# Patient Record
Sex: Female | Born: 1985 | Race: White | Hispanic: No | Smoking: Former smoker
Health system: Southern US, Community
[De-identification: ages and names within clinical notes are randomized; demographics above are authoritative.]

## PROBLEM LIST (undated history)

## (undated) ENCOUNTER — Inpatient Hospital Stay (HOSPITAL_COMMUNITY): Payer: Self-pay

## (undated) DIAGNOSIS — F988 Other specified behavioral and emotional disorders with onset usually occurring in childhood and adolescence: Secondary | ICD-10-CM

## (undated) DIAGNOSIS — N189 Chronic kidney disease, unspecified: Secondary | ICD-10-CM

## (undated) DIAGNOSIS — R102 Pelvic and perineal pain: Secondary | ICD-10-CM

## (undated) DIAGNOSIS — M5430 Sciatica, unspecified side: Secondary | ICD-10-CM

## (undated) DIAGNOSIS — D649 Anemia, unspecified: Secondary | ICD-10-CM

## (undated) HISTORY — DX: Sciatica, unspecified side: M54.30

## (undated) HISTORY — PX: OTHER SURGICAL HISTORY: SHX169

---
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
## 2003-05-31 ENCOUNTER — Emergency Department (HOSPITAL_COMMUNITY): Admission: EM | Admit: 2003-05-31 | Discharge: 2003-05-31 | Payer: Self-pay | Admitting: Emergency Medicine

## 2003-11-16 ENCOUNTER — Emergency Department (HOSPITAL_COMMUNITY): Admission: EM | Admit: 2003-11-16 | Discharge: 2003-11-16 | Payer: Self-pay | Admitting: Family Medicine

## 2005-05-05 ENCOUNTER — Emergency Department (HOSPITAL_COMMUNITY): Admission: EM | Admit: 2005-05-05 | Discharge: 2005-05-05 | Payer: Self-pay | Admitting: Family Medicine

## 2005-09-10 ENCOUNTER — Encounter: Admission: RE | Admit: 2005-09-10 | Discharge: 2005-09-10 | Payer: Self-pay | Admitting: Family Medicine

## 2006-01-11 ENCOUNTER — Ambulatory Visit (HOSPITAL_COMMUNITY): Admission: RE | Admit: 2006-01-11 | Discharge: 2006-01-12 | Payer: Self-pay | Admitting: Otolaryngology

## 2006-01-11 ENCOUNTER — Encounter (INDEPENDENT_AMBULATORY_CARE_PROVIDER_SITE_OTHER): Payer: Self-pay | Admitting: *Deleted

## 2006-01-11 HISTORY — PX: TONSILLECTOMY: SUR1361

## 2006-05-21 ENCOUNTER — Emergency Department (HOSPITAL_COMMUNITY): Admission: EM | Admit: 2006-05-21 | Discharge: 2006-05-21 | Payer: Self-pay | Admitting: Emergency Medicine

## 2007-06-13 ENCOUNTER — Emergency Department (HOSPITAL_COMMUNITY): Admission: EM | Admit: 2007-06-13 | Discharge: 2007-06-13 | Payer: Self-pay | Admitting: Family Medicine

## 2008-05-10 ENCOUNTER — Emergency Department (HOSPITAL_COMMUNITY): Admission: EM | Admit: 2008-05-10 | Discharge: 2008-05-10 | Payer: Self-pay | Admitting: Emergency Medicine

## 2008-05-21 HISTORY — PX: LEEP: SHX91

## 2008-05-27 ENCOUNTER — Ambulatory Visit: Payer: Self-pay | Admitting: Family Medicine

## 2008-05-27 ENCOUNTER — Encounter: Admission: RE | Admit: 2008-05-27 | Discharge: 2008-05-27 | Payer: Self-pay | Admitting: Family Medicine

## 2008-06-09 ENCOUNTER — Ambulatory Visit: Payer: Self-pay | Admitting: Family Medicine

## 2008-06-09 ENCOUNTER — Other Ambulatory Visit: Admission: RE | Admit: 2008-06-09 | Discharge: 2008-06-09 | Payer: Self-pay | Admitting: Family Medicine

## 2008-06-12 ENCOUNTER — Emergency Department (HOSPITAL_COMMUNITY): Admission: EM | Admit: 2008-06-12 | Discharge: 2008-06-12 | Payer: Self-pay | Admitting: Emergency Medicine

## 2008-06-14 ENCOUNTER — Emergency Department (HOSPITAL_COMMUNITY): Admission: EM | Admit: 2008-06-14 | Discharge: 2008-06-14 | Payer: Self-pay | Admitting: Family Medicine

## 2008-08-09 ENCOUNTER — Ambulatory Visit: Payer: Self-pay | Admitting: Family Medicine

## 2008-08-27 ENCOUNTER — Encounter (INDEPENDENT_AMBULATORY_CARE_PROVIDER_SITE_OTHER): Payer: Self-pay | Admitting: General Surgery

## 2008-08-27 ENCOUNTER — Ambulatory Visit (HOSPITAL_BASED_OUTPATIENT_CLINIC_OR_DEPARTMENT_OTHER): Admission: RE | Admit: 2008-08-27 | Discharge: 2008-08-27 | Payer: Self-pay | Admitting: General Surgery

## 2008-08-27 HISTORY — PX: PILONIDAL CYST EXCISION: SHX744

## 2008-10-04 ENCOUNTER — Ambulatory Visit: Payer: Self-pay | Admitting: Family Medicine

## 2009-01-28 ENCOUNTER — Ambulatory Visit (HOSPITAL_COMMUNITY): Admission: RE | Admit: 2009-01-28 | Discharge: 2009-01-28 | Payer: Self-pay | Admitting: Specialist

## 2009-01-28 ENCOUNTER — Encounter (INDEPENDENT_AMBULATORY_CARE_PROVIDER_SITE_OTHER): Payer: Self-pay | Admitting: Obstetrics & Gynecology

## 2009-01-28 HISTORY — PX: DILATION AND CURETTAGE OF UTERUS: SHX78

## 2009-01-28 HISTORY — PX: CERVICAL CONIZATION W/BX: SHX1330

## 2009-04-01 ENCOUNTER — Ambulatory Visit: Payer: Self-pay | Admitting: Family Medicine

## 2010-01-23 ENCOUNTER — Emergency Department (HOSPITAL_COMMUNITY): Admission: EM | Admit: 2010-01-23 | Discharge: 2010-01-24 | Payer: Self-pay | Admitting: Emergency Medicine

## 2010-02-13 ENCOUNTER — Ambulatory Visit: Payer: Self-pay | Admitting: Family Medicine

## 2010-03-07 ENCOUNTER — Ambulatory Visit: Payer: Self-pay | Admitting: Family Medicine

## 2010-08-03 LAB — URINE MICROSCOPIC-ADD ON

## 2010-08-03 LAB — URINALYSIS, ROUTINE W REFLEX MICROSCOPIC
Bilirubin Urine: NEGATIVE
Glucose, UA: NEGATIVE mg/dL
Ketones, ur: NEGATIVE mg/dL
Leukocytes, UA: NEGATIVE
Nitrite: NEGATIVE
Protein, ur: NEGATIVE mg/dL
Specific Gravity, Urine: 1.017 (ref 1.005–1.030)
Urobilinogen, UA: 1 mg/dL (ref 0.0–1.0)
pH: 6 (ref 5.0–8.0)

## 2010-08-03 LAB — POCT PREGNANCY, URINE: Preg Test, Ur: NEGATIVE

## 2010-08-25 LAB — CBC
HCT: 39.8 % (ref 36.0–46.0)
Hemoglobin: 13.5 g/dL (ref 12.0–15.0)
MCV: 91.4 fL (ref 78.0–100.0)
Platelets: 230 10*3/uL (ref 150–400)
RBC: 4.36 MIL/uL (ref 3.87–5.11)
WBC: 7.9 10*3/uL (ref 4.0–10.5)

## 2010-08-25 LAB — PROTIME-INR: INR: 0.9 (ref 0.00–1.49)

## 2010-08-29 ENCOUNTER — Inpatient Hospital Stay (HOSPITAL_COMMUNITY): Payer: Medicaid Other

## 2010-08-29 ENCOUNTER — Inpatient Hospital Stay (HOSPITAL_COMMUNITY)
Admission: AD | Admit: 2010-08-29 | Discharge: 2010-08-29 | Disposition: A | Discharge status: Home / Self Care | Payer: Medicaid Other | Source: Ambulatory Visit | Attending: Family Medicine | Admitting: Family Medicine

## 2010-08-29 DIAGNOSIS — O209 Hemorrhage in early pregnancy, unspecified: Secondary | ICD-10-CM | POA: Insufficient documentation

## 2010-08-29 LAB — CBC
HCT: 36.1 % (ref 36.0–46.0)
MCHC: 33.2 g/dL (ref 30.0–36.0)
MCV: 90 fL (ref 78.0–100.0)
Platelets: 181 10*3/uL (ref 150–400)
RDW: 13.1 % (ref 11.5–15.5)

## 2010-08-29 LAB — URINE MICROSCOPIC-ADD ON

## 2010-08-29 LAB — URINALYSIS, ROUTINE W REFLEX MICROSCOPIC
Ketones, ur: NEGATIVE mg/dL
Protein, ur: NEGATIVE mg/dL
pH: 6.5 (ref 5.0–8.0)

## 2010-08-29 LAB — WET PREP, GENITAL
Clue Cells Wet Prep HPF POC: NONE SEEN
Trich, Wet Prep: NONE SEEN

## 2010-08-30 LAB — POCT HEMOGLOBIN-HEMACUE: Hemoglobin: 13.2 g/dL (ref 12.0–15.0)

## 2010-09-04 LAB — CULTURE, ROUTINE-ABSCESS

## 2010-09-30 ENCOUNTER — Inpatient Hospital Stay (HOSPITAL_COMMUNITY)
Admission: AD | Admit: 2010-09-30 | Discharge: 2010-09-30 | Disposition: A | Discharge status: Home / Self Care | Payer: Medicaid Other | Source: Ambulatory Visit | Attending: Obstetrics & Gynecology | Admitting: Obstetrics & Gynecology

## 2010-09-30 DIAGNOSIS — R109 Unspecified abdominal pain: Secondary | ICD-10-CM | POA: Insufficient documentation

## 2010-09-30 DIAGNOSIS — O209 Hemorrhage in early pregnancy, unspecified: Secondary | ICD-10-CM | POA: Insufficient documentation

## 2010-09-30 LAB — URINALYSIS, ROUTINE W REFLEX MICROSCOPIC
Leukocytes, UA: NEGATIVE
Nitrite: NEGATIVE
Protein, ur: NEGATIVE mg/dL
Specific Gravity, Urine: <1.005 — ABNORMAL LOW (ref 1.005–1.030)
Urobilinogen, UA: 0.2 mg/dL (ref 0.0–1.0)
pH: 6 (ref 5.0–8.0)

## 2010-09-30 LAB — WET PREP, GENITAL
Clue Cells Wet Prep HPF POC: NONE SEEN
Trich, Wet Prep: NONE SEEN

## 2010-09-30 LAB — URINE MICROSCOPIC-ADD ON

## 2010-10-03 NOTE — Op Note (Signed)
Gloria Weber, Gloria Weber            ACCOUNT NO.:  [id number]   MEDICAL RECORD NO.:  [id number]          PATIENT TYPE:  AMB   LOCATION:  [facility]                         FACILITY:  [facility]   PHYSICIAN:  Angelia Mould. Derrell Lolling, M.D.DATE OF BIRTH:  Sep 02, 1985   DATE OF PROCEDURE:  DATE OF DISCHARGE:                               OPERATIVE REPORT   PREOPERATIVE DIAGNOSIS:  Recurrent pilonidal abscess.   POSTOPERATIVE DIAGNOSIS:  Recurrent pilonidal abscess.   OPERATION PERFORMED:  Pilonidal cystectomy.   SURGEON:  Angelia Mould. Derrell Lolling, M.D.   OPERATIVE INDICATIONS:  This is a 25 year old Caucasian female who had a  pilonidal abscess in January 2010 which healed after incision and  drainage.  She was evaluated on August 10, 2008 for a recurrent pilonidal  abscess which underwent incision and drainage in the office.  This was a  large infected fluid collection.  This also healed.  She was seen  recently and observed to have a completely healed wound with no sign of  infection.  She was given the option of pilonidal cystectomy or  observation.  Due to the risk of recurrence, she was very emphatic in  that she wanted this area excised to prevent this from ever happening  again.  She was counseled regarding this, including  wound healing by  secondary intention over 4 weeks.  She understands all this and is  brought to the operating room electively.   OPERATIVE TECHNIQUE:  Following the induction of general endotracheal  anesthesia, the patient was placed in a prone slightly jackknife  position.  The gluteal and presacral and perianal skin were prepped and  draped in a sterile fashion.  I observed the healed wound with the  incision to the left of the midline.  Using a marking pen, I marked out  a sagittally oriented elliptical incision fairly conservative, being  approximately 6 cm in length by about 2.5 cm wide.  Incision was made  with the knife.  Dissection was carried down into the superficial  and  then deep subcutaneous tissue.  I excised all of the fibrotic scar  tissue down to the presacral area.  I did not encounter any purulence at  this time.  All of the fibrotic tissue from the sinus tracts and abscess  cavity were excised, leaving only soft adipose tissue.  Hemostasis was  excellent and achieved with electrocautery.  The wound was irrigated  with saline.  The wound was packed with saline moistened Kerlix.  A  clean bandage was placed.  The patient tolerated the procedure well and  was taken to the recovery room in stable condition.  Estimated blood  loss was about 10-15 mL.  Sponge and instrument counts were correct.  I  used about 20 mL of 0.25% Marcaine with epinephrine as a local.  Taken  to PACU stable condition.      Angelia Mould. Derrell Lolling, M.D.  Electronically Signed    HMI/MEDQ  D:  08/27/2008  T:  08/27/2008  Job:  [id number]   cc:   Milon Dikes, M.D.

## 2010-10-06 NOTE — Op Note (Signed)
[name], Gloria Weber            ACCOUNT NO.:  [id number]   MEDICAL RECORD NO.:  [id number]          PATIENT TYPE:  OIB   LOCATION:  2550                         FACILITY:  [facility]   PHYSICIAN:  Kinnie Scales. Annalee Genta, M.D.DATE OF BIRTH:  1985-11-07   DATE OF PROCEDURE:  01/11/2006  DATE OF DISCHARGE:                                 OPERATIVE REPORT   PREOPERATIVE DIAGNOSIS:  1. Recurrent tonsillitis.  2. Tonsillar hypertrophy.   POSTOPERATIVE DIAGNOSES:  1. Recurrent tonsillitis.  2. Tonsillar hypertrophy.   SURGICAL PROCEDURES:  Tonsillectomy.   ANESTHESIA:  General endotracheal.   SURGEON:  Dr. Annalee Genta   COMPLICATIONS:  None.   BLOOD LOSS:  Minimal.  The patient transferred from the operating room to  recovery room in stable condition.   BRIEF HISTORY:  The patient is a 25 year old white female who is referred  for evaluation of recurrent tonsillitis.  She has had multiple episodes of  infection requiring antibiotic therapy and has been treated at least six  times in the last year.  The patient has significant tonsillar hypertrophy.  She has a history of syncope and questionable cardiac arrhythmia and given  her history we recommended surgical procedure be performed at [facility] OR. The risks, benefits, and possible complications of  surgical procedure were discussed in detail with the patient understood and  concurred with plan for surgery.   PROCEDURE:  The patient brought to the operating room at [facility] OR on 01/11/2006 and was placed in supine position on the operating  table.  General endotracheal anesthesia was established without difficulty.  The patient adequately anesthetized, oral cavity and oropharynx were  examined.  No loose or broken teeth.  Hard and soft palate were intact.  Crowe-Davis mouth gag inserted without difficulty.  Posterior nasopharynx  was examined.  There is no significant adenoidal tissue and  adenoidectomy  was not performed.  Attention was then turned to tonsils. Beginning on the  left-hand side and dissecting subcapsular fashion entire left tonsil was  dissected from superior pole to tongue base. Right tonsil was removed in  similar fashion. The tonsillar tissue sent to pathology for gross  microscopic evaluation.  The tonsillar fossae were gently abraded with a dry  tonsil sponge. Crowe-Davis mouth gag was released and reapplied.  There was  no active bleeding.  Several small areas of point hemorrhage were cauterized  with suction cautery. Nasal cavity, nasopharynx, oral cavity, oropharynx  then irrigated and suctioned.  Orogastric tube was passed into stomach and contents were aspirated.  CroweEarlene Plater mouth gag was released and removed.  No loose or broken teeth.  No  bleeding.  The patient awakened from anesthetic, extubated and was  transferred from the operating room to recovery room in stable condition.           ______________________________  Kinnie Scales Annalee Genta, M.D.     DLS/MEDQ  D:  14/78/2956  T:  01/11/2006  Job:  [id number]

## 2010-10-30 LAB — RUBELLA ANTIBODY, IGM: Rubella: UNDETERMINED

## 2010-10-30 LAB — HEPATITIS B SURFACE ANTIGEN: Hepatitis B Surface Ag: NEGATIVE

## 2010-10-30 LAB — RPR: RPR: NONREACTIVE

## 2011-01-02 ENCOUNTER — Ambulatory Visit: Payer: Medicaid Other | Attending: Obstetrics and Gynecology | Admitting: Physical Therapy

## 2011-01-20 ENCOUNTER — Inpatient Hospital Stay (HOSPITAL_COMMUNITY): Payer: Medicaid Other

## 2011-01-20 ENCOUNTER — Inpatient Hospital Stay (HOSPITAL_COMMUNITY)
Admission: AD | Admit: 2011-01-20 | Discharge: 2011-01-24 | DRG: 775 | Disposition: A | Discharge status: Home / Self Care | Payer: Medicaid Other | Source: Ambulatory Visit | Attending: Obstetrics and Gynecology | Admitting: Obstetrics and Gynecology

## 2011-01-20 ENCOUNTER — Encounter (HOSPITAL_COMMUNITY): Payer: Self-pay

## 2011-01-20 DIAGNOSIS — O411090 Infection of amniotic sac and membranes, unspecified, unspecified trimester, not applicable or unspecified: Secondary | ICD-10-CM | POA: Diagnosis present

## 2011-01-20 DIAGNOSIS — O26879 Cervical shortening, unspecified trimester: Secondary | ICD-10-CM | POA: Diagnosis present

## 2011-01-20 DIAGNOSIS — O479 False labor, unspecified: Secondary | ICD-10-CM

## 2011-01-20 DIAGNOSIS — O4290 Premature rupture of membranes, unspecified as to length of time between rupture and onset of labor, unspecified weeks of gestation: Principal | ICD-10-CM | POA: Diagnosis present

## 2011-01-20 LAB — URINALYSIS, ROUTINE W REFLEX MICROSCOPIC
Glucose, UA: NEGATIVE mg/dL
Protein, ur: NEGATIVE mg/dL
Specific Gravity, Urine: 1.025 (ref 1.005–1.030)
Urobilinogen, UA: 0.2 mg/dL (ref 0.0–1.0)

## 2011-01-20 LAB — URINE MICROSCOPIC-ADD ON

## 2011-01-20 LAB — WET PREP, GENITAL: Clue Cells Wet Prep HPF POC: NONE SEEN

## 2011-01-20 MED ORDER — CALCIUM CARBONATE ANTACID 500 MG PO CHEW
2.0000 | CHEWABLE_TABLET | ORAL | Status: DC | PRN
  Filled 2011-01-20: qty 2

## 2011-01-20 MED ORDER — CYCLOBENZAPRINE HCL 10 MG PO TABS
10.0000 mg | ORAL_TABLET | Freq: Three times a day (TID) | ORAL | Status: DC | PRN
  Administered 2011-01-21 (×2): 10 mg via ORAL
  Filled 2011-01-20 (×2): qty 1

## 2011-01-20 MED ORDER — DOCUSATE SODIUM 100 MG PO CAPS
100.0000 mg | ORAL_CAPSULE | Freq: Every day | ORAL | Status: DC
  Administered 2011-01-21 – 2011-01-24 (×3): 100 mg via ORAL
  Filled 2011-01-20 (×6): qty 1

## 2011-01-20 MED ORDER — NIFEDIPINE 10 MG PO CAPS
30.0000 mg | ORAL_CAPSULE | Freq: Once | ORAL | Status: AC
  Administered 2011-01-20: 30 mg via ORAL
  Filled 2011-01-20: qty 2
  Filled 2011-01-20: qty 1

## 2011-01-20 MED ORDER — PRENATAL PLUS 27-1 MG PO TABS
1.0000 | ORAL_TABLET | Freq: Every day | ORAL | Status: DC
  Filled 2011-01-20 (×3): qty 1

## 2011-01-20 MED ORDER — BETAMETHASONE SOD PHOS & ACET 6 (3-3) MG/ML IJ SUSP
12.0000 mg | INTRAMUSCULAR | Status: AC
  Administered 2011-01-20 – 2011-01-21 (×2): 12 mg via INTRAMUSCULAR
  Filled 2011-01-20 (×2): qty 2

## 2011-01-20 MED ORDER — NIFEDIPINE 10 MG PO CAPS
10.0000 mg | ORAL_CAPSULE | Freq: Four times a day (QID) | ORAL | Status: DC
  Administered 2011-01-20: 10 mg via ORAL
  Filled 2011-01-20 (×2): qty 1

## 2011-01-20 MED ORDER — BUPROPION HCL ER (SR) 150 MG PO TB12
150.0000 mg | ORAL_TABLET | Freq: Two times a day (BID) | ORAL | Status: DC
  Filled 2011-01-20 (×4): qty 1

## 2011-01-20 MED ORDER — LACTATED RINGERS IV SOLN
INTRAVENOUS | Status: DC
  Administered 2011-01-20 – 2011-01-21 (×2): via INTRAVENOUS
  Administered 2011-01-21: 1000 mL via INTRAVENOUS
  Administered 2011-01-21 – 2011-01-22 (×2): via INTRAVENOUS

## 2011-01-20 MED ORDER — ZOLPIDEM TARTRATE 10 MG PO TABS: 10.0000 mg | ORAL_TABLET | Freq: Every evening | ORAL | Status: DC | PRN

## 2011-01-20 MED ORDER — PRENATAL PLUS 27-1 MG PO TABS
1.0000 | ORAL_TABLET | Freq: Every day | ORAL | Status: DC
  Administered 2011-01-21 – 2011-01-24 (×4): 1 via ORAL
  Filled 2011-01-20 (×7): qty 1

## 2011-01-20 MED ORDER — ACETAMINOPHEN 325 MG PO TABS
650.0000 mg | ORAL_TABLET | ORAL | Status: DC | PRN
  Administered 2011-01-22 (×2): 650 mg via ORAL
  Filled 2011-01-20: qty 2
  Filled 2011-01-20: qty 1

## 2011-01-20 MED ORDER — TRAMADOL HCL 50 MG PO TABS
50.0000 mg | ORAL_TABLET | Freq: Four times a day (QID) | ORAL | Status: DC | PRN
  Administered 2011-01-21 (×2): 50 mg via ORAL
  Filled 2011-01-20 (×3): qty 1

## 2011-01-20 MED ORDER — NIFEDIPINE 10 MG PO CAPS: 10.0000 mg | ORAL_CAPSULE | ORAL | Status: DC

## 2011-01-20 NOTE — ED Notes (Signed)
OK per French Ana CNM to leave EFM off until pt transfers to antenatal room

## 2011-01-20 NOTE — Progress Notes (Signed)
Having 6 contractions in one hour, onset around 1:00 p.m., no vaginal bleeding

## 2011-01-20 NOTE — ED Notes (Signed)
To Rm 156 via w/c

## 2011-01-20 NOTE — ED Notes (Signed)
Hillary Steelman CNM in to see pt and discuss u/s results. 

## 2011-01-20 NOTE — Progress Notes (Signed)
Pt states," I started having contractions at 1 pm and have been having 6 in an hour."

## 2011-01-20 NOTE — Progress Notes (Signed)
Pt up to BR. Ante will call when room ready for pt's transfer. Report called to Selena Batten RN at 2145

## 2011-01-20 NOTE — ED Notes (Signed)
Alonna Minium CNM at bedside

## 2011-01-20 NOTE — H&P (Signed)
Gloria Weber is a 25 y.o. MWF G2P1001 at 29.2 wks presenting for preterm labor eval just before 1800. Pt called around 2pm c/o of  2 strong ctxs over the previous hr.  Worked from 9-2, and was going home, and instructed patient to get off feet, increase water at home,  And observe ctxs over next hr.  She called back over the next 1-2 hrs and c/o 6 ctxs in previous hr and felt like she'd "lost her mucous plug."  Pt instructed to come to MAU For eval.  Denies IC in over a week.  Reports GFM.  No abnl d/c, VB, or other c/o's.  Last had u/s at office 12/25/10 and cx length =4cm and normal growth around 50%. Had 1 hr gtt 01/11/11 and nml=73.  Mother-in-law and husband at bs.  Maternal Medical History:  Reason for admission: Reason for admission: contractions.  Contractions: Onset was 6-12 hours ago.   Frequency: irregular.   Perceived severity is moderate.    Fetal activity: Perceived fetal activity is normal.   Last perceived fetal movement was within the past hour.    Prenatal complications: 1.  H/o gestational HTN 2. Depression/anxiety--Paxil exposure 1st trimester w/ nml fetal echo 2nd trimester; currently on Wellbutrin 3.  Migraines 4.  Social issues 5.  Smoker 6.  [facility]/LEEP '10-'11 7.  Motrin allergy 8.  1st trimester vb 9.  Chronic back/ musculoskeletal pain-previous injury    OB History    Grav Para Term Preterm Abortions TAB SAB Ect Mult Living   2 1 1   0 0 0 0  1     Past Medical History  Diagnosis Date  . Abnormal Pap smear   . Pilonidal cyst 2010   Past Surgical History  Procedure Date  . Leep 2010  . Cervical conization w/bx 2010  . Pilonidal cyst excision 2010  . Tonsillectomy   . Dilation and curettage of uterus    Family History: family history is not on file. Social History:  reports that she has been smoking.  She has never used smokeless tobacco. She reports that she does not drink alcohol or use illicit drugs.  Review of Systems  Constitutional:  Negative.   Eyes: Negative.   Respiratory: Negative.   Cardiovascular: Negative.   Gastrointestinal: Negative.   Genitourinary: Negative.   Musculoskeletal: Positive for back pain and joint pain.  Skin: Negative.   Neurological: Negative.   Psychiatric/Behavioral: Positive for depression. The patient is nervous/anxious.     Dilation: Closed Effacement (%):  (will get [place] afor cervical lenght) Station: Ballotable Blood pressure 121/73, pulse 101, temperature 98.7 F (37.1 C), temperature source Oral, resp. rate 16, height 5\' 2"  (1.575 m), weight 84.913 kg (187 lb 3.2 oz). Maternal Exam:  Uterine Assessment: Contraction strength is mild.  Contraction frequency is irregular.  On arrival to MAU just before 6pm, ctxs first noted to be every 3-8 minutes; did space some after 30mg  of po Procardia at 1847 to about every 7-9 minutes, and since about 2030, have picked up again with some every 2-4 minutes, but some 6-31min.  Abdomen: Patient reports no abdominal tenderness. Fetal presentation: vertex  Introitus: Normal vulva. Ferning test: not done.  Nitrazine test: not done. Vulva/labia with slight erythema  Pelvis: adequate for delivery.   Cervix: Cervix evaluated by sterile speculum exam and digital exam.   Cx:  Closed/ 50%/ballotable mucousy d/c at os. No blood in vault.  Nonodorous d/c.  Fetal Exam Fetal Monitor Review: Mode: ultrasound.  Baseline rate: 150.  Variability: moderate (6-25 bpm).   Pattern: accelerations present and no decelerations.    Fetal State Assessment: Category I - tracings are normal.    wet prep negative U/a wnl x 40 ketones, sg=1.025, few epithelial FFN:  positive Limited OB u/s:  Cephalic, FHR=155; AFI subjectively WNL; TV cx length=1.4 cm  Physical Exam  Constitutional: She is oriented to person, place, and time. She appears well-developed and well-nourished.  Cardiovascular: Normal rate and regular rhythm.   Respiratory: Effort normal and breath  sounds normal.  GI: Soft. Bowel sounds are normal.  Genitourinary: Vagina normal.  Musculoskeletal: Normal range of motion. She exhibits no edema.  Neurological: She is alert and oriented to person, place, and time. She has normal reflexes.  Skin: Skin is warm and dry.       Acne--face  Psychiatric: She has a normal mood and affect. Her behavior is normal. Thought content normal.    Prenatal labs: ABO, Rh: --/--/A POS (04/10 0210) Antibody:  neg  Rubella:  immune RPR:   nr HBsAg:  neg  HIV:   neg  GBS:   done today GC/CT done today  Assessment/Plan: 1.  IUP at 29.2 2.  Preterm ctxs persist despite 1 dose of 30mg  Procardia 3.  Positive FFN 4.  Shortened cx, but closed on bimanual exam 5.  Reactive FHT 6.  Back pain 7.  Anxiety/Depression 8.  Ketonuria   1.  Per c/w Dr. Normand Sloop, admit to Antepartum for continued monitoring 2.  BMZ x2--1st dose now 3.  IV hydration 4.  10mg  po Procardia now, and then continue q 6 hrs ATC 5.  SCDs, BR w/ BRP, Reg diet 6.  Will continue to observe closely.  Gloria Weber H 01/20/2011, 9:14 PM

## 2011-01-21 MED ORDER — HYDROCODONE-ACETAMINOPHEN 5-325 MG PO TABS
1.0000 | ORAL_TABLET | Freq: Four times a day (QID) | ORAL | Status: DC | PRN
  Administered 2011-01-21 – 2011-01-22 (×3): 2 via ORAL
  Filled 2011-01-21 (×4): qty 2

## 2011-01-21 MED ORDER — BUTORPHANOL TARTRATE 2 MG/ML IJ SOLN
2.0000 mg | Freq: Once | INTRAMUSCULAR | Status: AC
  Administered 2011-01-21: 2 mg via INTRAVENOUS
  Filled 2011-01-21: qty 1

## 2011-01-21 MED ORDER — BUPROPION HCL ER (XL) 300 MG PO TB24
300.0000 mg | ORAL_TABLET | Freq: Every day | ORAL | Status: DC
  Administered 2011-01-21 – 2011-01-24 (×4): 300 mg via ORAL
  Filled 2011-01-21 (×8): qty 1

## 2011-01-21 MED ORDER — NIFEDIPINE 10 MG PO CAPS
10.0000 mg | ORAL_CAPSULE | Freq: Four times a day (QID) | ORAL | Status: DC
  Administered 2011-01-21 – 2011-01-22 (×4): 10 mg via ORAL
  Filled 2011-01-21 (×3): qty 1

## 2011-01-21 NOTE — Progress Notes (Signed)
SCD hose on

## 2011-01-21 NOTE — Progress Notes (Signed)
Dr Normand Sloop notified at 0840 of pt's c/o of contractions and back pain.  MD requested that RN check cervix, give Procardia 10 mg po, and may give Ultram and/or Flexeril

## 2011-01-21 NOTE — Progress Notes (Signed)
Dr Normand Sloop updated on pt.  Pt does not appear as uncomfortable but states uc's are 8,  Pt c/o more back pain than contraction pain.  No uc's noted per efm.  Cardio and toco readjusted

## 2011-01-21 NOTE — Progress Notes (Signed)
Pt with complaints of back pain and pain with contractions.  No leakage of fluid or VB.  Good FM AVSS FHTS 145 reactive category 1 Toco q 3-5 in an hour Pt in NAD Gen Pt with some pain but in no acute distress CV RRR Lungs CTAB abd  Gravid soft and NT GU no vb cervix is closed/50/high EXt no calf tenderness Assessment and Plan 29 weeks PTL vicodin prn pts pain.  she has no relief with Ultram or flexeril

## 2011-01-21 NOTE — Progress Notes (Signed)
Husband bathing pt.  Pt does not want staff in room

## 2011-01-21 NOTE — Progress Notes (Signed)
Pt states she felt a gush of fluid. Amnio swab done, negative. Dr. Normand Sloop notified and aware and wants pt to wear peripad. Dr Normand Sloop wants pt to have AFI done in the morning on 01/22/11.

## 2011-01-21 NOTE — Progress Notes (Signed)
Cardio readjusted

## 2011-01-22 ENCOUNTER — Inpatient Hospital Stay (HOSPITAL_COMMUNITY): Payer: Medicaid Other

## 2011-01-22 ENCOUNTER — Encounter (HOSPITAL_COMMUNITY): Payer: Self-pay | Admitting: Anesthesiology

## 2011-01-22 ENCOUNTER — Inpatient Hospital Stay (HOSPITAL_COMMUNITY): Payer: Medicaid Other | Admitting: Anesthesiology

## 2011-01-22 ENCOUNTER — Other Ambulatory Visit: Payer: Self-pay | Admitting: Obstetrics and Gynecology

## 2011-01-22 ENCOUNTER — Encounter (HOSPITAL_COMMUNITY): Payer: Self-pay | Admitting: *Deleted

## 2011-01-22 LAB — MAGNESIUM: Magnesium: 4.9 mg/dL — ABNORMAL HIGH (ref 1.5–2.5)

## 2011-01-22 LAB — CBC
Hemoglobin: 11.2 g/dL — ABNORMAL LOW (ref 12.0–15.0)
MCHC: 33.1 g/dL (ref 30.0–36.0)
Platelets: 183 10*3/uL (ref 150–400)
RBC: 3.61 MIL/uL — ABNORMAL LOW (ref 3.87–5.11)

## 2011-01-22 LAB — DIFFERENTIAL
Basophils Relative: 0 % (ref 0–1)
Eosinophils Absolute: 0 10*3/uL (ref 0.0–0.7)
Eosinophils Relative: 0 % (ref 0–5)
Lymphocytes Relative: 5 % — ABNORMAL LOW (ref 12–46)
Monocytes Absolute: 1.5 10*3/uL — ABNORMAL HIGH (ref 0.1–1.0)
Neutrophils Relative %: 89 % — ABNORMAL HIGH (ref 43–77)

## 2011-01-22 MED ORDER — OXYCODONE-ACETAMINOPHEN 5-325 MG PO TABS: 2.0000 | ORAL_TABLET | ORAL | Status: DC | PRN

## 2011-01-22 MED ORDER — OXYTOCIN 20 UNITS IN LACTATED RINGERS INFUSION - SIMPLE
1.0000 m[IU]/min | INTRAVENOUS | Status: DC
  Administered 2011-01-22: 1 m[IU]/min via INTRAVENOUS
  Filled 2011-01-22: qty 1000

## 2011-01-22 MED ORDER — LACTATED RINGERS IV SOLN: 500.0000 mL | INTRAVENOUS | Status: DC | PRN

## 2011-01-22 MED ORDER — SODIUM CHLORIDE 0.9 % IV SOLN: 1.0000 g | Freq: Four times a day (QID) | INTRAVENOUS | Status: DC

## 2011-01-22 MED ORDER — SODIUM CHLORIDE 0.9 % IV SOLN
2.0000 g | Freq: Four times a day (QID) | INTRAVENOUS | Status: DC
  Administered 2011-01-22 (×2): 2 g via INTRAVENOUS
  Filled 2011-01-22 (×3): qty 2000

## 2011-01-22 MED ORDER — SODIUM CHLORIDE 0.9 % IV SOLN
3.0000 g | Freq: Four times a day (QID) | INTRAVENOUS | Status: DC
  Administered 2011-01-22 – 2011-01-23 (×4): 3 g via INTRAVENOUS
  Filled 2011-01-22 (×6): qty 3

## 2011-01-22 MED ORDER — PENICILLIN G POTASSIUM 5000000 UNITS IJ SOLR
2.5000 10*6.[IU] | INTRAVENOUS | Status: DC
  Administered 2011-01-22 (×2): 2.5 10*6.[IU] via INTRAVENOUS
  Filled 2011-01-22 (×6): qty 2.5

## 2011-01-22 MED ORDER — SODIUM CHLORIDE 0.9 % IV SOLN
500.0000 mg | Freq: Four times a day (QID) | INTRAVENOUS | Status: DC
  Administered 2011-01-22: 500 mg via INTRAVENOUS
  Filled 2011-01-22 (×3): qty 500

## 2011-01-22 MED ORDER — EPHEDRINE 5 MG/ML INJ: 10.0000 mg | INTRAVENOUS | Status: DC | PRN

## 2011-01-22 MED ORDER — TERBUTALINE SULFATE 1 MG/ML IJ SOLN: 0.2500 mg | Freq: Once | INTRAMUSCULAR | Status: DC | PRN

## 2011-01-22 MED ORDER — MAGNESIUM SULFATE 40 G IN LACTATED RINGERS - SIMPLE
2.0000 g/h | INTRAVENOUS | Status: DC
  Filled 2011-01-22: qty 500

## 2011-01-22 MED ORDER — PHENYLEPHRINE 40 MCG/ML (10ML) SYRINGE FOR IV PUSH (FOR BLOOD PRESSURE SUPPORT)
80.0000 ug | PREFILLED_SYRINGE | INTRAVENOUS | Status: DC | PRN
  Filled 2011-01-22: qty 5

## 2011-01-22 MED ORDER — ONDANSETRON HCL 4 MG/2ML IJ SOLN
4.0000 mg | Freq: Four times a day (QID) | INTRAMUSCULAR | Status: DC | PRN
  Administered 2011-01-22: 4 mg via INTRAVENOUS
  Filled 2011-01-22: qty 2

## 2011-01-22 MED ORDER — MAGNESIUM SULFATE 40 G IN LACTATED RINGERS - SIMPLE
2.0000 g/h | INTRAVENOUS | Status: DC
  Administered 2011-01-22: 2 g/h via INTRAVENOUS
  Filled 2011-01-22: qty 500

## 2011-01-22 MED ORDER — MAGNESIUM SULFATE BOLUS VIA INFUSION
4.0000 g | Freq: Once | INTRAVENOUS | Status: AC
  Administered 2011-01-22: 4 g via INTRAVENOUS
  Filled 2011-01-22: qty 500

## 2011-01-22 MED ORDER — PENICILLIN G POTASSIUM 5000000 UNITS IJ SOLR
5.0000 10*6.[IU] | Freq: Once | INTRAVENOUS | Status: AC
  Administered 2011-01-22: 5 10*6.[IU] via INTRAVENOUS
  Filled 2011-01-22: qty 5

## 2011-01-22 MED ORDER — CITRIC ACID-SODIUM CITRATE 334-500 MG/5ML PO SOLN: 30.0000 mL | ORAL | Status: DC | PRN

## 2011-01-22 MED ORDER — FENTANYL 2.5 MCG/ML BUPIVACAINE 1/10 % EPIDURAL INFUSION (WH - ANES)
14.0000 mL/h | INTRAMUSCULAR | Status: DC
  Administered 2011-01-22 (×2): 14 mL/h via EPIDURAL
  Administered 2011-01-22: 12 mL/h via EPIDURAL
  Administered 2011-01-22: 14 mL/h via EPIDURAL
  Filled 2011-01-22 (×4): qty 60

## 2011-01-22 MED ORDER — DIPHENHYDRAMINE HCL 50 MG/ML IJ SOLN: 12.5000 mg | INTRAMUSCULAR | Status: DC | PRN

## 2011-01-22 MED ORDER — OXYTOCIN BOLUS FROM INFUSION: 500.0000 mL | Freq: Once | INTRAVENOUS | Status: DC

## 2011-01-22 MED ORDER — BUTORPHANOL TARTRATE 2 MG/ML IJ SOLN
1.0000 mg | INTRAMUSCULAR | Status: DC | PRN
  Administered 2011-01-22 (×4): 1 mg via INTRAVENOUS
  Filled 2011-01-22 (×4): qty 1

## 2011-01-22 MED ORDER — OXYTOCIN 20 UNITS IN LACTATED RINGERS INFUSION - SIMPLE
125.0000 mL/h | Freq: Once | INTRAVENOUS | Status: DC
  Administered 2011-01-22: 999 mL/h via INTRAVENOUS

## 2011-01-22 MED ORDER — PHENYLEPHRINE 40 MCG/ML (10ML) SYRINGE FOR IV PUSH (FOR BLOOD PRESSURE SUPPORT): 80.0000 ug | PREFILLED_SYRINGE | INTRAVENOUS | Status: DC | PRN

## 2011-01-22 MED ORDER — LACTATED RINGERS IV SOLN: 500.0000 mL | Freq: Once | INTRAVENOUS | Status: DC

## 2011-01-22 MED ORDER — FLEET ENEMA 7-19 GM/118ML RE ENEM: 1.0000 | ENEMA | RECTAL | Status: DC | PRN

## 2011-01-22 MED ORDER — OXYCODONE-ACETAMINOPHEN 5-325 MG PO TABS
1.0000 | ORAL_TABLET | ORAL | Status: DC | PRN
  Administered 2011-01-22 – 2011-01-23 (×2): 2 via ORAL
  Administered 2011-01-23 (×3): 1 via ORAL
  Administered 2011-01-23 (×2): 2 via ORAL
  Administered 2011-01-24: 1 via ORAL
  Administered 2011-01-24: 2 via ORAL
  Administered 2011-01-24 (×2): 1 via ORAL
  Filled 2011-01-22: qty 1
  Filled 2011-01-22: qty 2
  Filled 2011-01-22: qty 1
  Filled 2011-01-22: qty 2
  Filled 2011-01-22 (×3): qty 1
  Filled 2011-01-22: qty 2
  Filled 2011-01-22: qty 1
  Filled 2011-01-22 (×2): qty 2

## 2011-01-22 MED ORDER — LIDOCAINE HCL (PF) 1 % IJ SOLN
30.0000 mL | INTRAMUSCULAR | Status: DC | PRN
  Filled 2011-01-22: qty 30

## 2011-01-22 MED ORDER — EPHEDRINE 5 MG/ML INJ
10.0000 mg | INTRAVENOUS | Status: DC | PRN
  Filled 2011-01-22: qty 4

## 2011-01-22 NOTE — Consult Note (Signed)
Neonatology Note:  Attendance at Delivery:  I was asked to attend this NSVD at 29 4/7 weeks following PROM 18 hours PTD and induction of labor due to chorioamnionitis. The mother is a G2P1 A pos, GBS neg with PTL. Her maximum temp during labor was 100.2, but she felt ill and Dr. Dillard felt she had chorioamnionitis. She received BMZ on 9/1-2 and had been on Erythromycin and Ampicillin > 4 hours PTD. Fluid minimally meconium-stained. Delivery was atraumatic, infant vigorous with good spontaneous cry and tone. Needed only minimal bulb suctioning. He was rapidly dried and placed into a portawarmer bag to help maintain body temp. He continued to cry. O2 saturation in room air was initially 77%, so the neopuff was applied. His saturation came up into the 90s quickly. Ap 8/9. Lungs clear to ausc in DR. He was shown briefly to the parents, then transported to the NICU on the neopuff and weaned down to 21% FIO2 en route.  Timmi Devora, MD  

## 2011-01-22 NOTE — Anesthesia Preprocedure Evaluation (Signed)

## 2011-01-22 NOTE — Progress Notes (Signed)
Subjective: Called to bs to assess patient at approximately 0350 secondary to lower abd pain.  RN stated pt had called out around 0320 w/ c/o increased d/c and did not appear ruptured.  Pt tearful and reports significant pain in suprapubic area, feels like contractions more frequent again.  Awoke pt out of her sleep.  Objective: BP 113/70  Pulse 84  Temp(Src) 98.2 F (36.8 C) (Oral)  Resp 22  Ht 5\' 2"  (1.575 m)  Wt 84.913 kg (187 lb 3.2 oz)  BMI 34.24 kg/m2      FHT:  FHR: 140 bpm, variability: moderate,  accelerations:  Present,  decelerations:  Absent UC:   regular, every 2-4 minutes mild to mod on palpation SVE:   Dilation: 1.5 Effacement (%): 80 Station: Ballotable Exam by:: Naim Murtha, Hillary, CNM Thick mec on pad, but with fluid noted as well, more clear.  Unable to assess presentation. Labs: Lab Results  Component Value Date   WBC 12.9* 08/29/2010   HGB 12.0 08/29/2010   HCT 36.1 08/29/2010   MCV 90.0 08/29/2010   PLT 181 08/29/2010    Assessment / Plan: SROM, Mec, 0320, and now dilatation noted  Labor: preterm Preeclampsia:  n/a Fetal Wellbeing:  Category I Pain Control:  Vicodin x2 at 0320; will give Stadol if still in pain now I/D:  n/a Anticipated MOD:  n/a  Per c/w dr. Normand Sloop:  D/c procardia and give 4gm Magnesium sulfate bolus, then 2gm/hr maintenance thereafter. Will begin PCN-G IV per protocol--GBS still pending. Complete OB u/s ordered for 0900; sonographer at bs at present for presentation only.  Emrik Erhard H 01/22/2011, 4:22 AM

## 2011-01-22 NOTE — Consult Note (Signed)
Neonatology Consult to Antenatal Patient:  Ms. Real is admitted due to PTL and  Premature ROM early this morning at 29 4/[redacted] weeks GA. She is currently febrile and induction is beginning. She is getting IV Ampicillin and received BMZ on 9/1-2. She received neuroprophylactic magnesium sulfate on admission.  I spoke with the patient and her parents. We discussed what to expect at delivery, including usual DR management, possible respiratory complications and need for support, IV access, feedings (mother desires breast and bottle feeding), LOS, Mortality and Morbidity, and long term outcomes. She had a few questions which I answered. I offered a NICU tour to any interested family members and would be glad to come back if she has more questions later.  Thank you for asking me to see this patient.  Deatra James, MD Neonatologist  Time spent: 20 min [phone])

## 2011-01-22 NOTE — Anesthesia Procedure Notes (Signed)
Epidural Patient location during procedure: OB Start time: 01/22/2011 10:53 AM  Staffing Anesthesiologist: Jiles Garter  Preanesthetic Checklist Completed: patient identified, site marked, surgical consent, pre-op evaluation, timeout performed, IV checked, risks and benefits discussed and monitors and equipment checked  Epidural Patient position: sitting Prep: site prepped and draped and DuraPrep Patient monitoring: continuous pulse ox and blood pressure Approach: midline Injection technique: LOR air  Needle:  Needle type: Tuohy  Needle gauge: 17 G Needle length: 9 cm Needle insertion depth: 7 cm Catheter type: closed end flexible Catheter size: 19 Gauge Catheter at skin depth: 14 cm Test dose: negative  Assessment Events: blood not aspirated, injection not painful, no injection resistance, negative IV test and no paresthesia  Additional Notes Dosing of Epidural: 1st dose, Through needle...... 5mg  Marcaine 2nd dose, through catheter.... epi 1:200K + Xylocaine 40 mg 3rd dose, through catheter...Marland KitchenMarland Kitchenepi 1:200K + Xylocaine 60 mg Each dose occurred after waiting 3 min,patient was free of IV sx; and patient exhibits no evidence of SA injection  Patient is more comfortable after epidural dosed. Please see RN's note for documentation of vital signs,and FHR which are stable.

## 2011-01-22 NOTE — Progress Notes (Signed)
Pt with complaints of pain from contractions.  No relief with stadol.  No VB.  Good FM pt with LOF AVSS FHTS 130 mod variability   Toco q Pt in NAD CV RRR Lungs CTAB abd  Gravid soft and NT GU no vb.  Moderate and thick meconium.  cx 1-2/50 per CNM EXt no calf tenderness [place] SIUP vtx  EFW 2-13 33% AFI 3.21 cx 3.82cm  Assessment and Plan PPROM and PTL at 20 weeks MGSO for neuroprophylaxisis will re evaluate in 12 hrs Mec concerning but MFM does not recommend induction PCN for GBS prophlaxis .  GBS unknown Pt seems to be laboring will transfer to L&D  NICu consult

## 2011-01-22 NOTE — Progress Notes (Signed)
Pt without complaints.she feels better with the epidural.   .  Good FM occ LOF AVSS FHTS 140 mod variability Toco q 2-90min Pt in NAD CV RRR Lungs CTAB abd  Gravid soft and NT GU no vb EXt no calf tenderness Assessment and Plan pprom and ptl Pain controlled with epidural Will assess again at five pm

## 2011-01-22 NOTE — Progress Notes (Signed)
Gloria Weber is a 25 y.o. G2P1001 at [redacted]w[redacted]d by ultrasound admitted for Preterm labor, PROM  Subjective: Pt without complaint.  No Cp or SOB   Objective: BP 95/52  Pulse 113  Temp(Src) 100.2 F (37.9 C) (Oral)  Resp 20  Ht 5\' 2"  (1.575 m)  Wt 84.913 kg (187 lb 3.2 oz)  BMI 34.24 kg/m2  SpO2 99% I/O last 3 completed shifts: In: 670 [P.O.:120; I.V.:300; IV Piggyback:250] Out: 225 [Urine:225] I/O this shift: In: 2796.9 [P.O.:1364; I.V.:1282.9; IV Piggyback:150] Out: 2075 [Urine:2075]  FHT:  FHR: 150 bpm, variability: minimal ,  accelerations:  Present,  decelerations:  Absent UC:   regular, every 3-5 minutes SVE:   Dilation: 3.5 Effacement (%): 80 Station: -2 Exam by:: j.[email or web address],rn Foul smelling dark colored amniotic fluid  Labs: Lab Results  Component Value Date   WBC 25.8* 01/22/2011   HGB 11.2* 01/22/2011   HCT 33.8* 01/22/2011   MCV 93.6 01/22/2011   PLT 183 01/22/2011    Assessment / Plan: PPROM and PTL with suspected Chorioamnionitis Pt with abd tenderness and foul smelling amniotic fluid continu abx  Labor: Labor progressing.  DC MGSO Preeclampsia:  no signs or symptoms of toxicity Fetal Wellbeing:  Category II Pain Control:  Epidural I/D:  n/a Anticipated MOD:  NSVD  Alera Quevedo A 01/22/2011, 5:13 PM

## 2011-01-22 NOTE — Progress Notes (Signed)
Delivery Note At 9:15 PM a viable female was delivered via Vaginal, Spontaneous Delivery (Presentation: Right Occiput Anterior).  APGAR: 8, 9; weight .   Placenta status: Intact, Spontaneous.  Cord: 3 vessels with the following complications: None.  Cord pH:7.40  Anesthesia: Epidural  Episiotomy: None Lacerations: None Suture Repair: none no lacerations noted Est. Blood Loss (mL):   Mom to postpartum.  Baby to NICU Culture done of placenta.  Scarleth Brame A 01/22/2011, 9:43 PM

## 2011-01-22 NOTE — Progress Notes (Signed)
Gloria Weber is a 25 y.o. G2P1001 at [redacted]w[redacted]d by ultrasound admitted for Preterm labor, PROM  Subjective: Pt co of ha  Objective: BP 96/45  Pulse 112  Temp(Src) 100 F (37.8 C) (Axillary)  Resp 20  Ht 5\' 2"  (1.575 m)  Wt 84.913 kg (187 lb 3.2 oz)  BMI 34.24 kg/m2  SpO2 99% I/O last 3 completed shifts: In: 3466.9 [P.O.:1484; I.V.:1582.9; IV Piggyback:400] Out: 2300 [Urine:2300]    FHT:  FHR: 150 bpm, variability: minimal ,  accelerations:  Present,  decelerations:  Absent UC:   regular, every 5 minutes SVE:   Dilation: 3.5 Effacement (%): 80 Station: -2 Exam by:: j.cox,rn Foul smelling amniotic fluid  Labs: Lab Results  Component Value Date   WBC 25.8* 01/22/2011   HGB 11.2* 01/22/2011   HCT 33.8* 01/22/2011   MCV 93.6 01/22/2011   PLT 183 01/22/2011    Assessment / Plan: chorio  PPROM and PTL.  Minimal cervical change.  will augment with Pitocin.Marland Kitchen tylenol for HA  Labor: augment Preeclampsia:  no signs or symptoms of toxicity Fetal Wellbeing:  Category II Pain Control:  Epidural I/D:  n/a Anticipated MOD:  NSVD  Eman Rynders A 01/22/2011, 7:44 PM

## 2011-01-22 NOTE — Progress Notes (Signed)
Subjective: Called to see pt around 0645 for worsening pain and concerns may be progressing in labor despite mag sulfate.  RN has continued to see MSF.  Pt tearful again at bs and breathing heavily with ctxs.  RN having difficulty tracing them.  Pain appears rhythmic, and intermittent, like ctxs, but toco not picking up.  Objective: BP 112/72  Pulse 93  Temp(Src) 98.5 F (36.9 C) (Oral)  Resp 20  Ht 5\' 2"  (1.575 m)  Wt 84.913 kg (187 lb 3.2 oz)  BMI 34.24 kg/m2 I/O last 3 completed shifts: In: 545 [P.O.:120; I.V.:175; IV Piggyback:250] Out: 75 [Urine:75]    FHT:  FHR: 140 bpm, variability: moderate,  accelerations:  Abscent,  decelerations:  Present occ'l mild variable UC:   irregular, every 4-5 minutes  Difficult to assess secondary to toco not tracing SVE:   Dilation: 1.5 Effacement (%): 80 Station: [employer] Exam by:: French Ana, CNM  Labs: Lab Results  Component Value Date   WBC 12.9* 08/29/2010   HGB 12.0 08/29/2010   HCT 36.1 08/29/2010   MCV 90.0 08/29/2010   PLT 181 08/29/2010    Assessment / Plan: No cx change, but concerns with pain, that may progress despite tocolytic  Fetal Wellbeing:  Category I Pain Control:  pt has had 2 doses of stadol...will consult w/ MD r/e further managment of pain   Milon Dethloff H 01/22/2011, 7:14 AM

## 2011-01-22 NOTE — Progress Notes (Signed)
Stated to check patient in 2 hours and to call with SVE.

## 2011-01-22 NOTE — Progress Notes (Signed)
After speaking with the RN she stated the fluid is brown and has a foul odor.  Will DC PCN and start amp and erthromycin for broader coverage

## 2011-01-22 NOTE — Plan of Care (Signed)
Problem: Consults Goal: Birthing Suites Patient Information Press F2 to bring up selections list Outcome: Completed/Met Date Met:  01/22/11  Pt < [redacted] weeks EGA

## 2011-01-22 NOTE — Progress Notes (Signed)
Dr Joana Reamer also made aware of SVE and Dr Normand Sloop coming in for delivery.

## 2011-01-23 ENCOUNTER — Encounter (HOSPITAL_COMMUNITY): Payer: Self-pay | Admitting: *Deleted

## 2011-01-23 LAB — CBC
HCT: 29.8 % — ABNORMAL LOW (ref 36.0–46.0)
Hemoglobin: 9.8 g/dL — ABNORMAL LOW (ref 12.0–15.0)
RBC: 3.17 MIL/uL — ABNORMAL LOW (ref 3.87–5.11)
WBC: 27.9 10*3/uL — ABNORMAL HIGH (ref 4.0–10.5)

## 2011-01-23 LAB — GC/CHLAMYDIA PROBE AMP, GENITAL: Chlamydia, DNA Probe: NEGATIVE

## 2011-01-23 MED ORDER — WITCH HAZEL-GLYCERIN EX PADS: 1.0000 "application " | MEDICATED_PAD | CUTANEOUS | Status: DC | PRN

## 2011-01-23 MED ORDER — DIPHENHYDRAMINE HCL 25 MG PO CAPS: 25.0000 mg | ORAL_CAPSULE | Freq: Four times a day (QID) | ORAL | Status: DC | PRN

## 2011-01-23 MED ORDER — TETANUS-DIPHTH-ACELL PERTUSSIS 5-2.5-18.5 LF-MCG/0.5 IM SUSP
0.5000 mL | Freq: Once | INTRAMUSCULAR | Status: AC
  Administered 2011-01-24: 0.5 mL via INTRAMUSCULAR
  Filled 2011-01-23: qty 0.5

## 2011-01-23 MED ORDER — ONDANSETRON HCL 4 MG/2ML IJ SOLN: 4.0000 mg | INTRAMUSCULAR | Status: DC | PRN

## 2011-01-23 MED ORDER — ONDANSETRON HCL 4 MG PO TABS: 4.0000 mg | ORAL_TABLET | ORAL | Status: DC | PRN

## 2011-01-23 MED ORDER — LANOLIN HYDROUS EX OINT: TOPICAL_OINTMENT | CUTANEOUS | Status: DC | PRN

## 2011-01-23 MED ORDER — MEASLES, MUMPS & RUBELLA VAC ~~LOC~~ INJ
0.5000 mL | INJECTION | Freq: Once | SUBCUTANEOUS | Status: DC
  Filled 2011-01-23: qty 0.5

## 2011-01-23 MED ORDER — SIMETHICONE 80 MG PO CHEW: 80.0000 mg | CHEWABLE_TABLET | ORAL | Status: DC | PRN

## 2011-01-23 MED ORDER — METHYLERGONOVINE MALEATE 0.2 MG PO TABS: 0.2000 mg | ORAL_TABLET | ORAL | Status: DC | PRN

## 2011-01-23 MED ORDER — METHYLERGONOVINE MALEATE 0.2 MG/ML IJ SOLN: 0.2000 mg | INTRAMUSCULAR | Status: DC | PRN

## 2011-01-23 MED ORDER — SENNOSIDES-DOCUSATE SODIUM 8.6-50 MG PO TABS
2.0000 | ORAL_TABLET | Freq: Every day | ORAL | Status: DC
  Administered 2011-01-23: 2 via ORAL

## 2011-01-23 MED ORDER — ZOLPIDEM TARTRATE 5 MG PO TABS: 5.0000 mg | ORAL_TABLET | Freq: Every evening | ORAL | Status: DC | PRN

## 2011-01-23 MED ORDER — TETANUS-DIPHTH-ACELL PERTUSSIS 5-2.5-18.5 LF-MCG/0.5 IM SUSP
0.5000 mL | Freq: Once | INTRAMUSCULAR | Status: DC
  Filled 2011-01-23: qty 0.5

## 2011-01-23 MED ORDER — DIBUCAINE 1 % RE OINT: 1.0000 "application " | TOPICAL_OINTMENT | RECTAL | Status: DC | PRN

## 2011-01-23 MED ORDER — BENZOCAINE-MENTHOL 20-0.5 % EX AERO: 1.0000 "application " | INHALATION_SPRAY | CUTANEOUS | Status: DC | PRN

## 2011-01-23 MED ORDER — SODIUM CHLORIDE 0.9 % IV SOLN: 3.0000 g | Freq: Four times a day (QID) | INTRAVENOUS | Status: DC

## 2011-01-23 MED ORDER — SODIUM CHLORIDE 0.9 % IJ SOLN
3.0000 mL | Freq: Two times a day (BID) | INTRAMUSCULAR | Status: DC
  Administered 2011-01-23 (×3): 3 mL via INTRAVENOUS

## 2011-01-23 MED ORDER — LACTATED RINGERS IV SOLN: INTRAVENOUS | Status: DC

## 2011-01-23 NOTE — Progress Notes (Signed)
Post Partum Day #1 Subjective: no complaints and per RN.  Patient is currently in NICU seeing baby.  Objective: Blood pressure 92/60, pulse 72, temperature 97.6 F (36.4 C), temperature source Oral, Tmax 100.0, resp. rate 18, height 5\' 2"  (1.575 m), weight 84.913 kg (187 lb 3.2 oz), SpO2 99.00%, unknown if currently breastfeeding.  Physical Exam:  General: alert and no distress Lochia: appropriate Uterine Fundus: firm and nontender DVT Evaluation: No evidence of DVT seen on physical exam.   Basename 01/23/11 0515 01/22/11 1021  HGB 9.8* 11.2*  HCT 29.8* 33.8*    Assessment/Plan: Plan for discharge tomorrow.  Continue PP care.  Patient has been afebrile.  D/c unasyn when 24hrs afebrile.   LOS: 3 days   Deyona Soza Y 01/23/2011, 6:17 PM

## 2011-01-23 NOTE — Progress Notes (Signed)
UR Chart review completed.  

## 2011-01-24 LAB — CULTURE, BETA STREP (GROUP B ONLY)

## 2011-01-24 LAB — DIFFERENTIAL
Eosinophils Absolute: 0.1 10*3/uL (ref 0.0–0.7)
Eosinophils Relative: 1 % (ref 0–5)
Lymphs Abs: 2.7 10*3/uL (ref 0.7–4.0)
Monocytes Relative: 6 % (ref 3–12)

## 2011-01-24 LAB — CBC
Hemoglobin: 9.8 g/dL — ABNORMAL LOW (ref 12.0–15.0)
MCH: 30.5 pg (ref 26.0–34.0)
MCV: 95 fL (ref 78.0–100.0)
RBC: 3.21 MIL/uL — ABNORMAL LOW (ref 3.87–5.11)

## 2011-01-24 MED ORDER — OXYCODONE-ACETAMINOPHEN 5-325 MG PO TABS: 1.0000 | ORAL_TABLET | ORAL | Status: AC | PRN

## 2011-01-24 MED ORDER — MEASLES, MUMPS & RUBELLA VAC ~~LOC~~ INJ
0.5000 mL | INJECTION | Freq: Once | SUBCUTANEOUS | Status: DC
  Filled 2011-01-24 (×2): qty 0.5

## 2011-01-24 MED ORDER — MEDROXYPROGESTERONE ACETATE 150 MG/ML IM SUSP
150.0000 mg | Freq: Once | INTRAMUSCULAR | Status: AC
  Administered 2011-01-24: 150 mg via INTRAMUSCULAR
  Filled 2011-01-24: qty 1

## 2011-01-24 MED ORDER — MEASLES, MUMPS & RUBELLA VAC ~~LOC~~ INJ: 0.5000 mL | INJECTION | Freq: Once | SUBCUTANEOUS | Status: DC

## 2011-01-24 MED ORDER — MEDROXYPROGESTERONE ACETATE 150 MG/ML IM SUSP: 150.0000 mg | Freq: Once | INTRAMUSCULAR | Status: DC

## 2011-01-24 NOTE — Discharge Summary (Signed)
  Obstetric Discharge Summary Reason for Admission: 29 weeks, chorioamnionitis Prenatal Procedures: none Intrapartum Procedures: spontaneous vaginal delivery Postpartum Procedures: antibiotics Complications-Operative and Postpartum: none  Temp:  [97 F (36.1 C)-98.6 F (37 C)] 98.6 F (37 C) (09/05 1803) Pulse Rate:  [71-80] 77  (09/05 1803) Resp:  [16-18] 16  (09/05 1803) BP: (95-107)/(59-69) 106/65 mmHg (09/05 1803) SpO2:  [97 %-99 %] 97 % (09/05 1803) Hemoglobin  Date Value Range Status  01/24/2011 9.8* 12.0-15.0 (g/dL) Final     HCT  Date Value Range Status  01/24/2011 30.5* 36.0-46.0 (%) Final      Discharge Diagnoses: Amnionitis and preterm delivery at 29 weeks  Discharge Information: Date: 01/24/2011 Activity: unrestricted Diet: routine Medications:  Medication List  As of 01/24/2011  7:17 PM   ASK your doctor about these medications         buPROPion 150 MG 12 hr tablet   Commonly known as: WELLBUTRIN SR      cyclobenzaprine 10 MG tablet   Commonly known as: FLEXERIL      prenatal vitamin w/FE, FA 27-1 MG Tabs      traMADol 50 MG tablet   Commonly known as: ULTRAM           Condition: stable Instructions: refer to practice specific booklet Discharge to: home   Newborn Data: Live born in NICU    Jaryd Drew A 01/24/2011, 7:17 PM

## 2011-01-24 NOTE — Plan of Care (Signed)
Problem: Discharge Progression Outcomes Goal: MMR given as ordered Outcome: Not Applicable Date Met:  01/24/11 Will receive in the office.

## 2011-01-24 NOTE — Progress Notes (Signed)
Post Partum Day 2 Subjective:  Well. Lochia are normal. Voiding, ambulating, tolerating normal diet. Breastpumping going well.  Objective: Blood pressure 106/65, pulse 77, temperature 98.6 F (37 C), temperature source Oral, resp. rate 16, height 5\' 2"  (1.575 m), weight 84.913 kg (187 lb 3.2 oz), SpO2 97.00%, unknown if currently breastfeeding.  Physical Exam:  General: normal Lochia: appropriate Uterine Fundus: 0/2 firm non-tender  Extremities: No evidence of DVT seen on physical exam. Edema moderate     Basename 01/24/11 0500 01/23/11 0515  HGB 9.8* 9.8*  HCT 30.5* 29.8*    Assessment/Plan: Normal Post-partum.  Discharge home today   [facility] A MD 01/24/2011, 7:15 PM

## 2011-01-26 LAB — CULTURE, ROUTINE-ABSCESS

## 2011-02-10 NOTE — Anesthesia Postprocedure Evaluation (Signed)
Patient stable following vaginal delivery.  

## 2011-02-10 NOTE — Addendum Note (Signed)
Addendum  created 02/10/11 0818 by Barbarann Kelly L. Rodman Pickle   Modules edited:Notes Section

## 2012-03-02 ENCOUNTER — Emergency Department (INDEPENDENT_AMBULATORY_CARE_PROVIDER_SITE_OTHER)
Admission: EM | Admit: 2012-03-02 | Discharge: 2012-03-02 | Disposition: A | Discharge status: Home / Self Care | Payer: Self-pay | Source: Home / Self Care | Attending: Family Medicine | Admitting: Family Medicine

## 2012-03-02 ENCOUNTER — Encounter (HOSPITAL_COMMUNITY): Payer: Self-pay | Admitting: Emergency Medicine

## 2012-03-02 DIAGNOSIS — S46911A Strain of unspecified muscle, fascia and tendon at shoulder and upper arm level, right arm, initial encounter: Secondary | ICD-10-CM

## 2012-03-02 DIAGNOSIS — IMO0002 Reserved for concepts with insufficient information to code with codable children: Secondary | ICD-10-CM

## 2012-03-02 LAB — POCT URINALYSIS DIP (DEVICE)
Bilirubin Urine: NEGATIVE
Nitrite: NEGATIVE
Specific Gravity, Urine: 1.015 (ref 1.005–1.030)
Urobilinogen, UA: 1 mg/dL (ref 0.0–1.0)
pH: 7.5 (ref 5.0–8.0)

## 2012-03-02 MED ORDER — HYDROCODONE-ACETAMINOPHEN 5-325 MG PO TABS
1.0000 | ORAL_TABLET | Freq: Once | ORAL | Status: AC
  Administered 2012-03-02: 1 via ORAL

## 2012-03-02 MED ORDER — HYDROCODONE-ACETAMINOPHEN 5-325 MG PO TABS
ORAL_TABLET | ORAL | Status: AC
  Filled 2012-03-02: qty 1

## 2012-03-02 MED ORDER — METHYLPREDNISOLONE 4 MG PO KIT: PACK | ORAL | Status: DC

## 2012-03-02 MED ORDER — HYDROCODONE-ACETAMINOPHEN 5-325 MG PO TABS: 1.0000 | ORAL_TABLET | Freq: Once | ORAL | Status: DC

## 2012-03-02 NOTE — ED Notes (Addendum)
Pt c/o upper back pain x 2 days. Pt states that it only hurts with movement. Pt denies injury.  Hx of sciatic problems with lower back and hips.  Pt has tried otc pain meds and heating pad/exercises with no relief of pain. Last dose of pain med tramadol 50 mg was 2 hours ago.

## 2012-03-02 NOTE — ED Provider Notes (Signed)
History     CSN: [id number]  Arrival date & time 03/02/12  1221   None     Chief Complaint  Patient presents with  . Back Pain    (Consider location/radiation/quality/duration/timing/severity/associated sxs/prior treatment) Patient is a 26 y.o. female presenting with back pain. The history is provided by the patient.  Back Pain  This is a new problem. The pain is associated with no known injury. The pain is present in the thoracic spine. The pain is at a severity of 8/10. The symptoms are aggravated by bending, twisting and certain positions.  Gloria Weber is a 26 y.o. female who complains of right mid back pain described as intermittent sharp in nature that began 2 days ago. The pain is aggravated with movement and twisting.  No known injury noted.  Works as a Production assistant, radio at [employer].  Has taken tramadol for pain with no relief.    Past Medical History  Diagnosis Date  . Abnormal Pap smear   . Pilonidal cyst 2010    Past Surgical History  Procedure Date  . Leep 2010  . Cervical conization w/bx 2010  . Pilonidal cyst excision 2010  . Tonsillectomy   . Dilation and curettage of uterus     Family History  Problem Relation Age of Onset  . Heart disease Father   . Diabetes Maternal Grandmother     History  Substance Use Topics  . Smoking status: Current Some Day Smoker -- 0.2 packs/day  . Smokeless tobacco: Never Used  . Alcohol Use: No    OB History    Grav Para Term Preterm Abortions TAB SAB Ect Mult Living   2 2 1 1  0 0 0 0  2      Review of Systems  Constitutional: Negative.   Respiratory: Negative.   Cardiovascular: Negative.   Musculoskeletal: Positive for back pain and arthralgias. Negative for joint swelling and gait problem.  Skin: Negative.   Neurological: Negative.     Allergies  Nsaids  Home Medications   Current Outpatient Rx  Name Route Sig Dispense Refill  . CYCLOBENZAPRINE HCL 10 MG PO TABS Oral Take 10 mg by mouth 3 (three) times  daily as needed.    Marland Kitchen TRAMADOL HCL 50 MG PO TABS Oral Take 50 mg by mouth every 6 (six) hours as needed.    . BUPROPION HCL ER (SR) 150 MG PO TB12 Oral Take 150 mg by mouth 2 (two) times daily.      Marland Kitchen HYDROCODONE-ACETAMINOPHEN 5-325 MG PO TABS Oral Take 1 tablet by mouth once. 10 tablet 0  . MEDROXYPROGESTERONE ACETATE 150 MG/ML IM SUSP Intramuscular Inject 1 mL (150 mg total) into the muscle once. 1 mL 0    Please give before discharge  . METHYLPREDNISOLONE 4 MG PO KIT  Take as directed 21 tablet 0  . PRENATAL PLUS 27-1 MG PO TABS Oral Take 1 tablet by mouth daily.        BP 107/75  Pulse 81  Temp 98.4 F (36.9 C) (Oral)  Resp 20  SpO2 100%  LMP 02/17/2012  Physical Exam  Nursing note and vitals reviewed. Constitutional: She is oriented to person, place, and time. Vital signs are normal. She appears well-developed and well-nourished. She is active and cooperative.  HENT:  Head: Normocephalic.  Eyes: Conjunctivae normal are normal. Pupils are equal, round, and reactive to light. No scleral icterus.  Neck: Trachea normal. Neck supple.  Cardiovascular: Normal rate, regular rhythm, normal heart sounds and  intact distal pulses.   Pulmonary/Chest: Effort normal and breath sounds normal.  Musculoskeletal:       Right shoulder: She exhibits decreased range of motion, tenderness and decreased strength. She exhibits no bony tenderness, no swelling, no effusion, no crepitus, no deformity, no laceration, no pain, no spasm and normal pulse.  Lymphadenopathy:    She has no cervical adenopathy.  Neurological: She is alert and oriented to person, place, and time. She has normal reflexes. No cranial nerve deficit or sensory deficit. Coordination and gait normal. GCS eye subscore is 4. GCS verbal subscore is 5. GCS motor subscore is 6.  Skin: Skin is warm and dry. No rash noted.  Psychiatric: She has a normal mood and affect. Her speech is normal and behavior is normal. Judgment and thought content  normal. Cognition and memory are normal.    ED Course  Procedures (including critical care time)  Labs Reviewed - No data to display No results found.   1. Right shoulder strain       MDM  Rest, intermittent application of cold packs (later, may switch to heat, but do not sleep on heating pad), analgesics and muscle relaxants as recommended.       [name], NP 03/02/12 1343

## 2012-03-02 NOTE — ED Provider Notes (Signed)
Medical screening examination/treatment/procedure(s) were performed by resident physician or non-physician practitioner and as supervising physician I was immediately available for consultation/collaboration.   Modell Fendrick DOUGLAS MD.    Damonte Frieson D Zakariyya Helfman, MD 03/02/12 1921 

## 2012-04-08 IMAGING — US US OB TRANSVAGINAL
1 series · 14 of 19 positions shown · non-contrast
Comparison: none

[Series 1: us ob transvaginal · 14 of 19 slices shown]
[im 1/19]
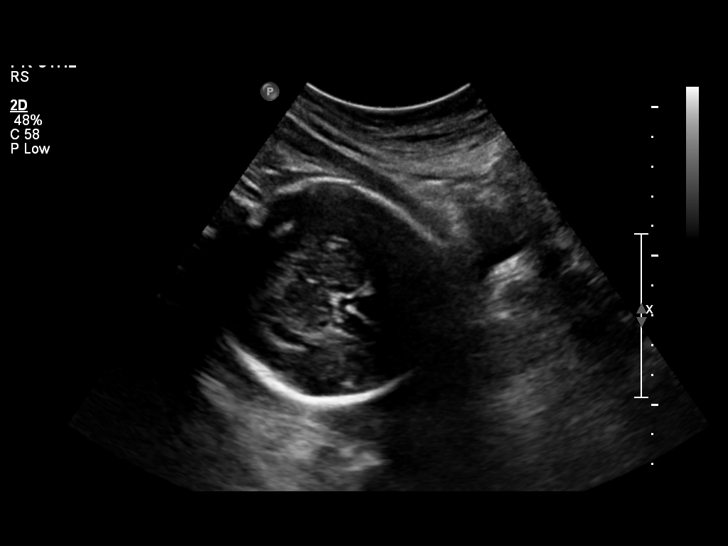
[im 3/19]
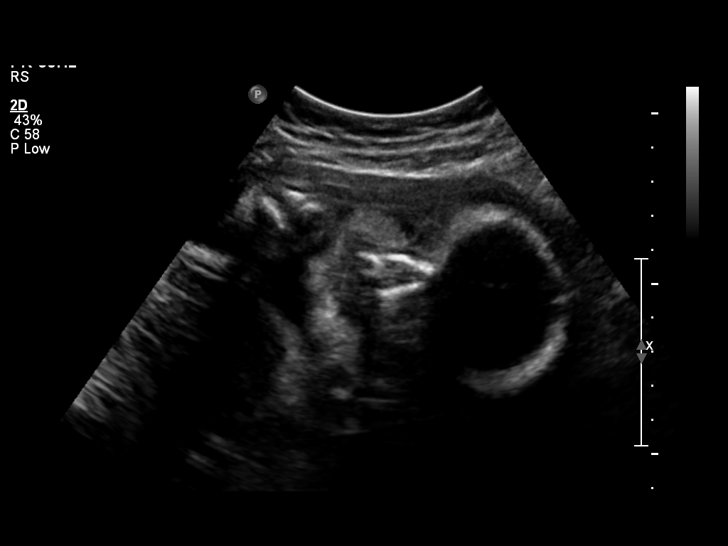
[im 4/19]
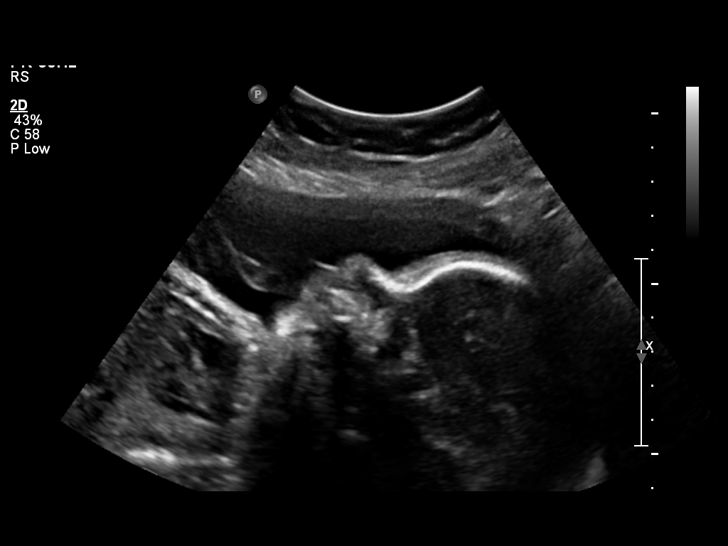
[im 5/19]
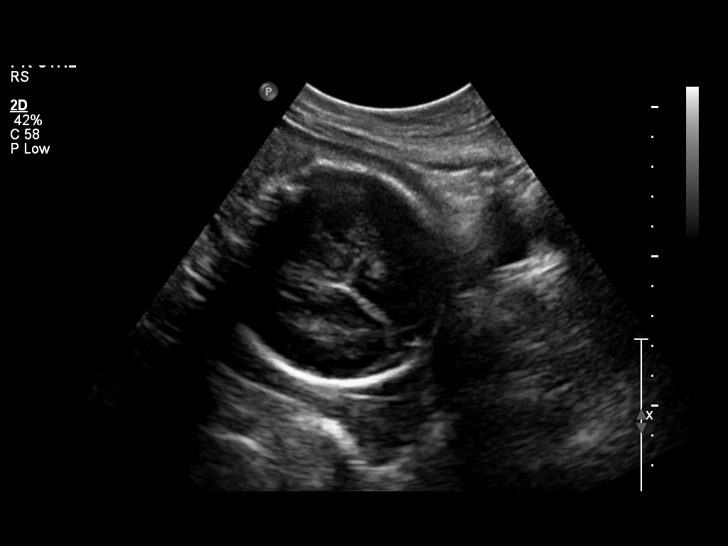
[im 7/19]
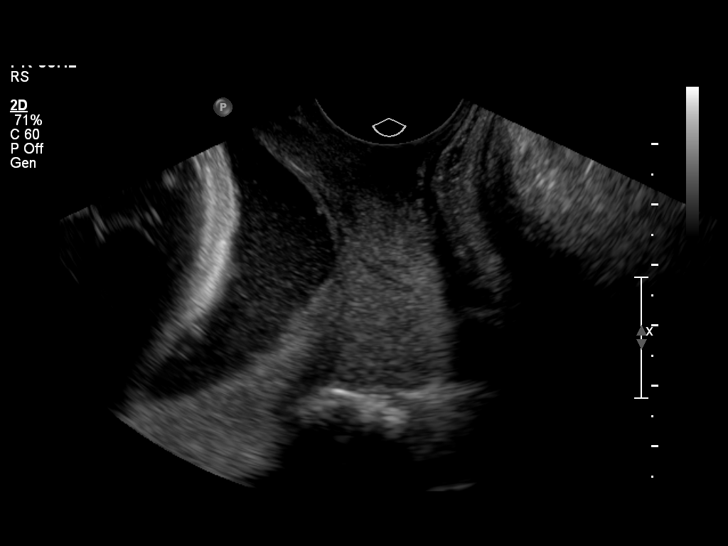
[im 8/19]
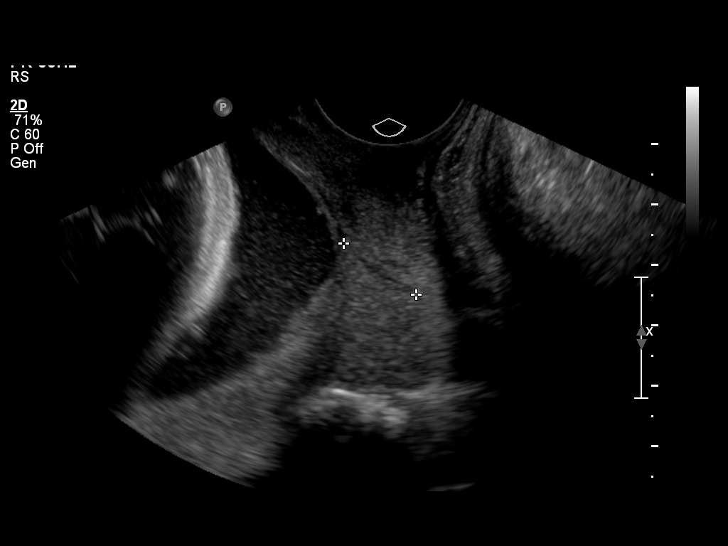
[im 9/19]
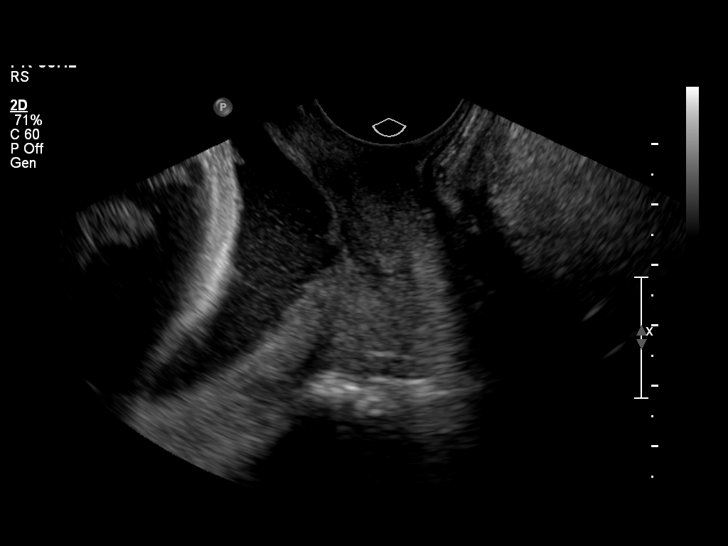
[im 11/19]
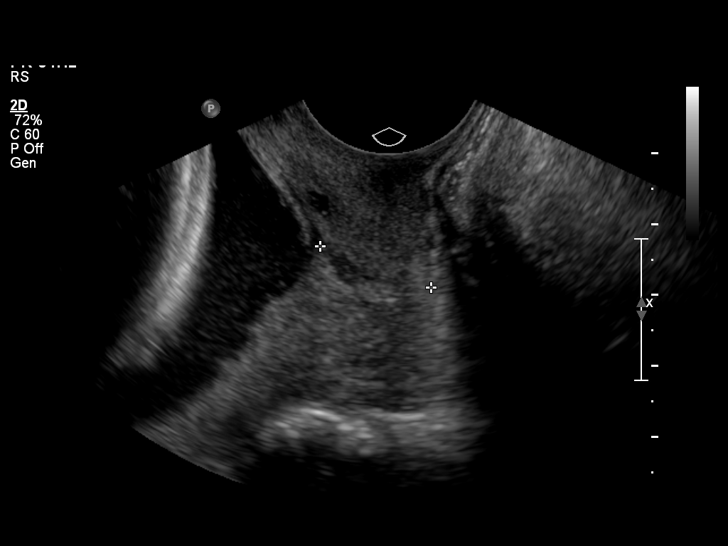
[im 12/19]
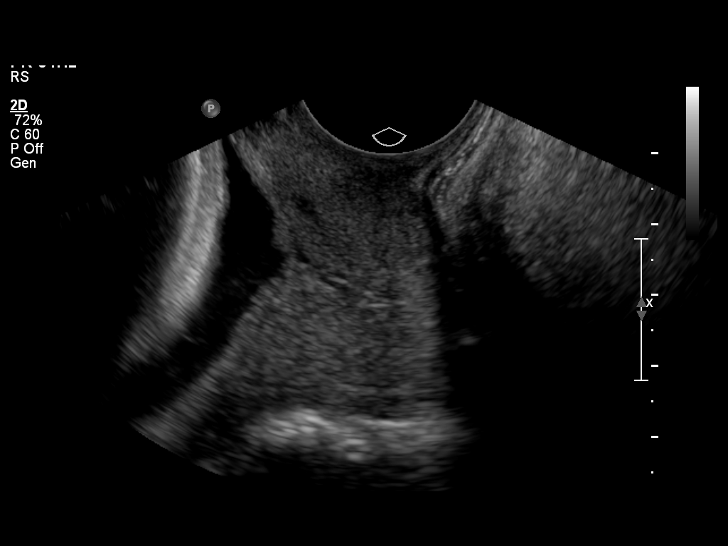
[im 13/19]
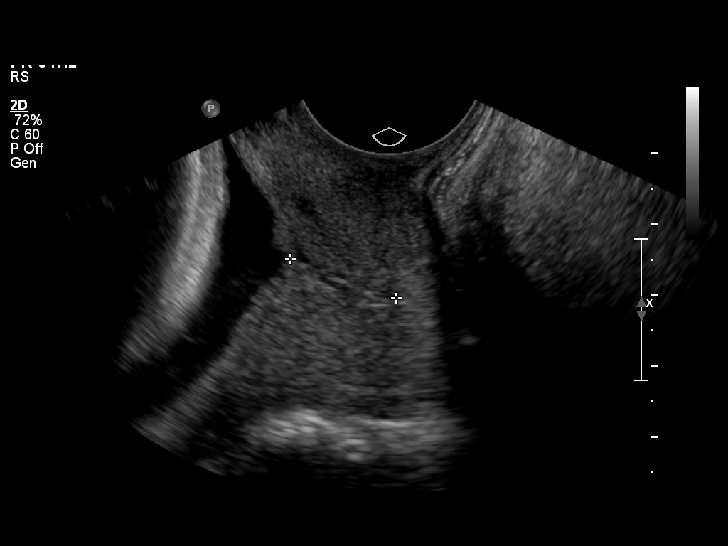
[im 15/19]
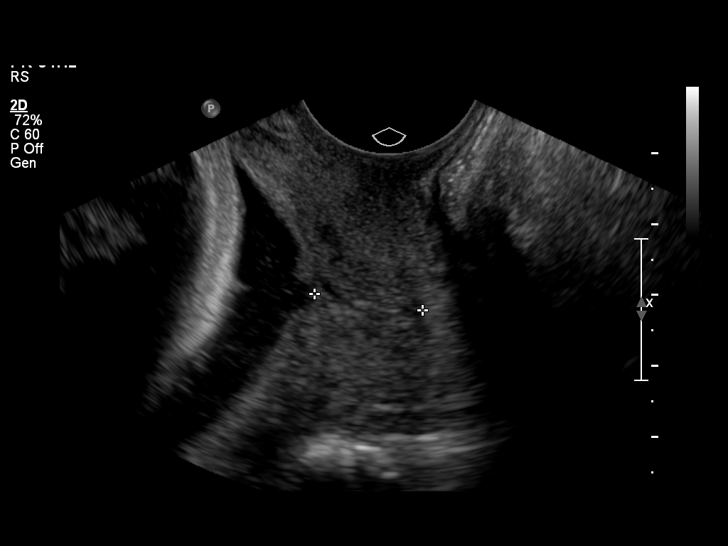
[im 16/19]
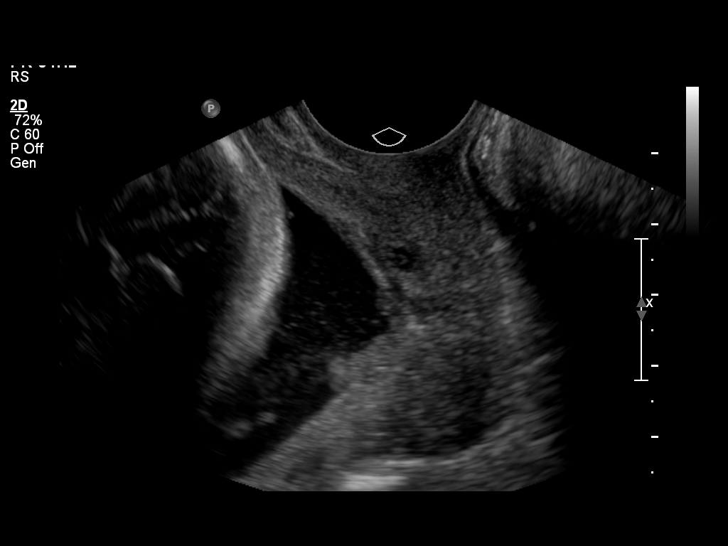
[im 17/19]
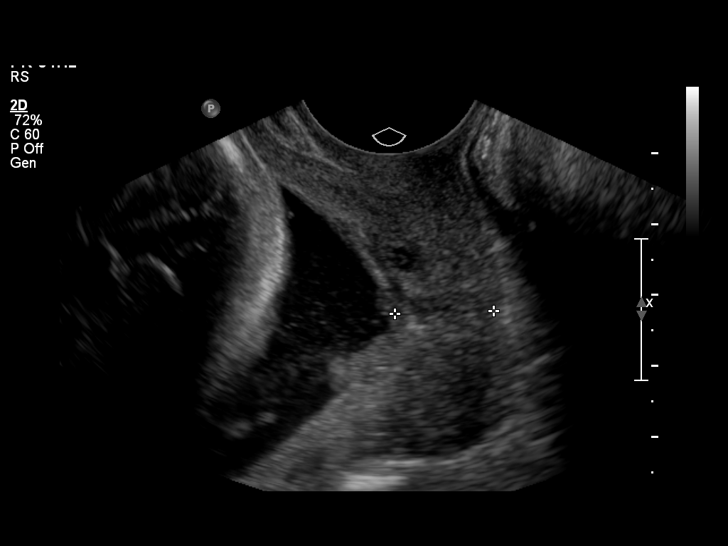
[im 19/19]
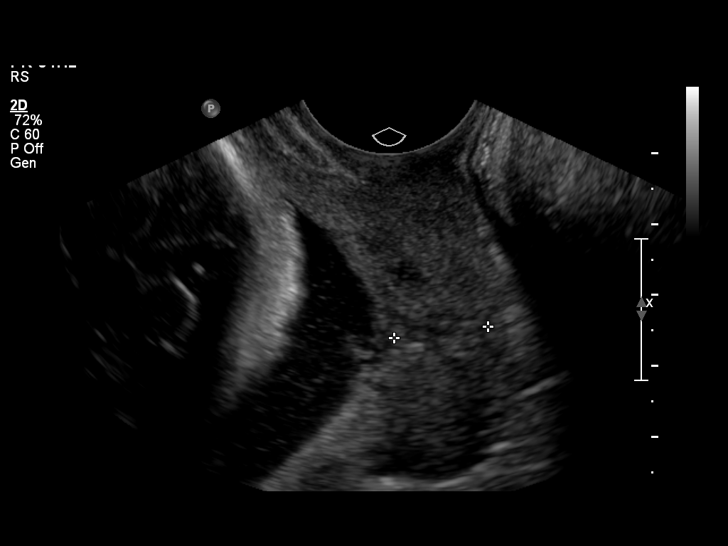

[14 of 19 positions shown; findings below may reference images not displayed]

OBSTETRICS REPORT
                      (Signed Final 01/23/2011 [DATE])

Procedures

 US OB TRANSVAGINAL                                    76817.0
Indications

 Assess cervical length
Fetal Evaluation

 Fetal Heart Rate:  155                          bpm
 Cardiac Activity:  Observed
 Presentation:      Cephalic
 Placenta:          Posterior, above cervical
                    os
Gestational Age

 Best:          29w 3d     Det. By:  Early Ultrasound         EDD:   04/04/11
Cervix Uterus Adnexa

 Cervical Length:    1.4      cm

 Cervix:       Measured transvaginally.
Impression

 The cervix is short, measuring 1.4 cm, transvaginally.

## 2014-03-22 ENCOUNTER — Encounter (HOSPITAL_COMMUNITY): Payer: Self-pay | Admitting: Emergency Medicine

## 2016-03-20 ENCOUNTER — Encounter (HOSPITAL_COMMUNITY): Payer: Self-pay | Admitting: Emergency Medicine

## 2016-03-20 ENCOUNTER — Inpatient Hospital Stay (HOSPITAL_COMMUNITY)
Admission: EM | Admit: 2016-03-20 | Discharge: 2016-03-23 | DRG: 872 | Disposition: A | Discharge status: Home / Self Care | Payer: BLUE CROSS/BLUE SHIELD | Attending: Internal Medicine | Admitting: Internal Medicine

## 2016-03-20 DIAGNOSIS — E876 Hypokalemia: Secondary | ICD-10-CM

## 2016-03-20 DIAGNOSIS — N938 Other specified abnormal uterine and vaginal bleeding: Secondary | ICD-10-CM | POA: Diagnosis present

## 2016-03-20 DIAGNOSIS — Z8249 Family history of ischemic heart disease and other diseases of the circulatory system: Secondary | ICD-10-CM

## 2016-03-20 DIAGNOSIS — D72829 Elevated white blood cell count, unspecified: Secondary | ICD-10-CM

## 2016-03-20 DIAGNOSIS — D509 Iron deficiency anemia, unspecified: Secondary | ICD-10-CM | POA: Diagnosis present

## 2016-03-20 DIAGNOSIS — Z833 Family history of diabetes mellitus: Secondary | ICD-10-CM

## 2016-03-20 DIAGNOSIS — N920 Excessive and frequent menstruation with regular cycle: Secondary | ICD-10-CM | POA: Diagnosis present

## 2016-03-20 DIAGNOSIS — Z886 Allergy status to analgesic agent status: Secondary | ICD-10-CM

## 2016-03-20 DIAGNOSIS — F17210 Nicotine dependence, cigarettes, uncomplicated: Secondary | ICD-10-CM | POA: Diagnosis present

## 2016-03-20 DIAGNOSIS — N12 Tubulo-interstitial nephritis, not specified as acute or chronic: Secondary | ICD-10-CM | POA: Diagnosis present

## 2016-03-20 DIAGNOSIS — R509 Fever, unspecified: Secondary | ICD-10-CM

## 2016-03-20 DIAGNOSIS — D649 Anemia, unspecified: Secondary | ICD-10-CM

## 2016-03-20 DIAGNOSIS — Z79899 Other long term (current) drug therapy: Secondary | ICD-10-CM

## 2016-03-20 DIAGNOSIS — R109 Unspecified abdominal pain: Secondary | ICD-10-CM

## 2016-03-20 DIAGNOSIS — A419 Sepsis, unspecified organism: Principal | ICD-10-CM | POA: Diagnosis present

## 2016-03-20 LAB — CBC
HCT: 34.9 % — ABNORMAL LOW (ref 36.0–46.0)
HEMOGLOBIN: 11.5 g/dL — AB (ref 12.0–15.0)
MCH: 30.2 pg (ref 26.0–34.0)
MCHC: 33 g/dL (ref 30.0–36.0)
MCV: 91.6 fL (ref 78.0–100.0)
Platelets: 223 10*3/uL (ref 150–400)
RBC: 3.81 MIL/uL — ABNORMAL LOW (ref 3.87–5.11)
RDW: 12.9 % (ref 11.5–15.5)
WBC: 18.5 10*3/uL — AB (ref 4.0–10.5)

## 2016-03-20 LAB — URINALYSIS, ROUTINE W REFLEX MICROSCOPIC
Bilirubin Urine: NEGATIVE
Glucose, UA: NEGATIVE mg/dL
Ketones, ur: NEGATIVE mg/dL
NITRITE: NEGATIVE
PROTEIN: 30 mg/dL — AB
Specific Gravity, Urine: 1.006 (ref 1.005–1.030)
pH: 6 (ref 5.0–8.0)

## 2016-03-20 LAB — BASIC METABOLIC PANEL
ANION GAP: 8 (ref 5–15)
BUN: 7 mg/dL (ref 6–20)
CALCIUM: 8.5 mg/dL — AB (ref 8.9–10.3)
CHLORIDE: 102 mmol/L (ref 101–111)
CO2: 25 mmol/L (ref 22–32)
CREATININE: 0.72 mg/dL (ref 0.44–1.00)
GFR calc non Af Amer: >60 mL/min (ref >60)
Glucose, Bld: 192 mg/dL — ABNORMAL HIGH (ref 65–99)
Potassium: 3.2 mmol/L — ABNORMAL LOW (ref 3.5–5.1)
SODIUM: 135 mmol/L (ref 135–145)

## 2016-03-20 LAB — URINE MICROSCOPIC-ADD ON

## 2016-03-20 LAB — POC URINE PREG, ED: PREG TEST UR: NEGATIVE

## 2016-03-20 MED ORDER — MORPHINE SULFATE (PF) 2 MG/ML IV SOLN
4.0000 mg | Freq: Once | INTRAVENOUS | Status: AC
  Administered 2016-03-20: 4 mg via INTRAVENOUS
  Filled 2016-03-20: qty 2

## 2016-03-20 MED ORDER — SODIUM CHLORIDE 0.9 % IV BOLUS (SEPSIS)
1000.0000 mL | Freq: Once | INTRAVENOUS | Status: AC
  Administered 2016-03-21: 1000 mL via INTRAVENOUS

## 2016-03-20 MED ORDER — MORPHINE SULFATE (PF) 2 MG/ML IV SOLN
4.0000 mg | Freq: Once | INTRAVENOUS | Status: AC
  Administered 2016-03-21: 4 mg via INTRAVENOUS
  Filled 2016-03-20: qty 2

## 2016-03-20 MED ORDER — SODIUM CHLORIDE 0.9 % IV BOLUS (SEPSIS)
2000.0000 mL | Freq: Once | INTRAVENOUS | Status: AC
  Administered 2016-03-20: 2000 mL via INTRAVENOUS

## 2016-03-20 MED ORDER — POTASSIUM CHLORIDE CRYS ER 20 MEQ PO TBCR
40.0000 meq | EXTENDED_RELEASE_TABLET | Freq: Once | ORAL | Status: AC
  Administered 2016-03-20: 40 meq via ORAL
  Filled 2016-03-20: qty 2

## 2016-03-20 MED ORDER — DEXTROSE 5 % IV SOLN
1.0000 g | Freq: Once | INTRAVENOUS | Status: AC
  Administered 2016-03-20: 1 g via INTRAVENOUS
  Filled 2016-03-20: qty 10

## 2016-03-20 MED ORDER — IBUPROFEN 200 MG PO TABS
400.0000 mg | ORAL_TABLET | Freq: Once | ORAL | Status: AC
  Administered 2016-03-20: 400 mg via ORAL
  Filled 2016-03-20: qty 2

## 2016-03-20 MED ORDER — ACETAMINOPHEN 325 MG PO TABS
650.0000 mg | ORAL_TABLET | Freq: Once | ORAL | Status: AC | PRN
  Administered 2016-03-20: 650 mg via ORAL
  Filled 2016-03-20: qty 2

## 2016-03-20 NOTE — Progress Notes (Signed)
Patient confirms her pcp is [facility].  System updated.

## 2016-03-20 NOTE — ED Triage Notes (Signed)
Pt c/o lower right sided flank pain starting 5 days ago. Pt states fever onset of today and now a generalized pain. States painful urination. Denies nausea, vomiting, diarrhea.

## 2016-03-20 NOTE — ED Provider Notes (Signed)
[facility]-EMERGENCY DEPT Provider Note   CSN: [id number] Arrival date & time: 03/20/16  1906  By signing my name below, I, [name], attest that this documentation has been prepared under the direction and in the presence of Rien Marland Camprubi-Soms, PA. Electronically Signed: [name], ED Scribe. 03/20/16. 9:30 PM.  History   Chief Complaint Chief Complaint  Patient presents with  . Flank Pain   The history is provided by the patient and medical records. No language interpreter was used.  Flank Pain  This is a new problem. The current episode started more than 2 days ago. The problem occurs constantly. The problem has not changed since onset.Pertinent negatives include no chest pain, no abdominal pain and no shortness of breath. Exacerbated by: movement and urination. Nothing relieves the symptoms. Treatments tried: advil, tramadol, norco. The treatment provided no relief.   HPI Comments: Gloria Weber is a 30 y.o. female who presents to the Emergency Department complaining of gradual onset, 8/10, constant, "squeezing" nonradiating R flank pain onset 5 days that is worsened with urination and movement and unrelieved with Tramadol, Advil, and Vicodin. Pt reports associated urinary hesitancy, fever (TMax: 103.6), dysuria, and urinary urgency. Unsure about blood in urine because she has DUB and chronic vaginal bleeding (undergoing outpatient work up). Fever began today and after 400 mg Advil at 4 PM, fever dropped down to 102.7. No PMHx of kidney disease, Urinary tract infection, DM2, or any other medical problems aside from DUB. She denies CP, SOB, abd pain, n/v/d/c, urinary frequency, numbness, tingling, unilateral weakness, or any other symptoms.   Past Medical History:  Diagnosis Date  . Abnormal Pap smear   . Pilonidal cyst 2010    There are no active problems to display for this patient.   Past Surgical History:  Procedure Laterality Date  . CERVICAL CONIZATION W/BX  2010    . DILATION AND CURETTAGE OF UTERUS    . LEEP  2010  . PILONIDAL CYST EXCISION  2010  . TONSILLECTOMY      OB History    Gravida Para Term Preterm AB Living   2 2 1 1  0 2   SAB TAB Ectopic Multiple Live Births   0 0 0   2       Home Medications    Prior to Admission medications   Medication Sig Start Date End Date Taking? Authorizing Provider  buPROPion (WELLBUTRIN SR) 150 MG 12 hr tablet Take 150 mg by mouth 2 (two) times daily.      Historical Provider, MD  cyclobenzaprine (FLEXERIL) 10 MG tablet Take 10 mg by mouth 3 (three) times daily as needed.    Historical Provider, MD  HYDROcodone-acetaminophen (NORCO/VICODIN) 5-325 MG per tablet Take 1 tablet by mouth once. 03/02/12   [name], NP  medroxyPROGESTERone (DEPO-PROVERA) 150 MG/ML injection Inject 1 mL (150 mg total) into the muscle once. 01/24/11   Silverio Lay, MD  methylPREDNISolone (MEDROL DOSEPAK) 4 MG tablet Take as directed 03/02/12   [name], NP  prenatal vitamin w/FE, FA (PRENATAL 1 + 1) 27-1 MG TABS Take 1 tablet by mouth daily.      Historical Provider, MD  traMADol (ULTRAM) 50 MG tablet Take 50 mg by mouth every 6 (six) hours as needed.    Historical Provider, MD    Family History Family History  Problem Relation Age of Onset  . Heart disease Father   . Diabetes Maternal Grandmother     Social History Social History  Substance Use Topics  . Smoking status: Current Some Day Smoker    Packs/day: 0.25  . Smokeless tobacco: Never Used  . Alcohol use No     Allergies   Nsaids  Review of Systems Review of Systems  Constitutional: Positive for fever.  Respiratory: Negative for shortness of breath.   Cardiovascular: Negative for chest pain.  Gastrointestinal: Negative for abdominal pain, constipation, diarrhea, nausea and vomiting.  Genitourinary: Positive for dysuria, flank pain and urgency. Negative for frequency and hematuria.       +Hesitancy  Musculoskeletal: Negative for  arthralgias and myalgias.  Skin: Negative for color change.  Allergic/Immunologic: Negative for immunocompromised state.  Neurological: Negative for weakness and numbness.  Psychiatric/Behavioral: Negative for confusion.  10 Systems reviewed and are negative for acute change except as noted in the HPI.   Physical Exam Updated Vital Signs BP 127/71 (BP Location: Left Arm)   Pulse 120   Temp 100.8 F (38.2 C) (Oral)   Resp 15   Ht 5' (1.524 m)   Wt 156 lb 1.6 oz (70.8 kg)   SpO2 100%   BMI 30.49 kg/m  Re-check VS: BP 99/58 (BP Location: Right Arm)   Pulse 97   Temp 98.8 F (37.1 C) (Oral)   Resp 16   Ht 5' (1.524 m)   Wt 70.8 kg   SpO2 100%   BMI 30.49 kg/m    Physical Exam  Constitutional: She is oriented to person, place, and time. She appears well-developed and well-nourished.  Non-toxic appearance. No distress.  Febrile to 100.8, nontoxic, NAD although appears uncomfortable  HENT:  Head: Normocephalic and atraumatic.  Mouth/Throat: Oropharynx is clear and moist. Mucous membranes are dry.  Mildly dry mucous membranes  Eyes: Conjunctivae and EOM are normal. Right eye exhibits no discharge. Left eye exhibits no discharge.  Neck: Normal range of motion. Neck supple.  Cardiovascular: Regular rhythm, normal heart sounds and intact distal pulses.  Tachycardia present.  Exam reveals no gallop and no friction rub.   No murmur heard. Pulmonary/Chest: Effort normal and breath sounds normal. No respiratory distress. She has no decreased breath sounds. She has no wheezes. She has no rhonchi. She has no rales.  Abdominal: Soft. Normal appearance and bowel sounds are normal. She exhibits no distension. There is tenderness in the right upper quadrant, right lower quadrant and suprapubic area. There is CVA tenderness. There is no rigidity, no rebound, no guarding, no tenderness at McBurney's point and negative Murphy's sign.  Soft, Non distended, +BS throughout with moderate suprapubic  and right sided abdominal TTP tracking towards the right flank,  no r/g/r, neg murphy's, neg mcburney's, +right sided CVA TTP  Musculoskeletal: Normal range of motion.  Neurological: She is alert and oriented to person, place, and time. She has normal strength. No sensory deficit.  Skin: Skin is warm, dry and intact. No rash noted.  Psychiatric: She has a normal mood and affect.  Nursing note and vitals reviewed.   ED Treatments / Results  DIAGNOSTIC STUDIES: Oxygen Saturation is 100% on RA, normal by my interpretation.    COORDINATION OF CARE: 9:13 PM Discussed treatment plan with pt at bedside which includes Ibuprofen and Urinalysis  and pt agreed to plan.  Labs (all labs ordered are listed, but only abnormal results are displayed) Labs Reviewed  URINALYSIS, ROUTINE W REFLEX MICROSCOPIC (NOT AT [facility]) - Abnormal; Notable for the following:       Result Value   APPearance CLOUDY (*)    Hgb  urine dipstick MODERATE (*)    Protein, ur 30 (*)    Leukocytes, UA LARGE (*)    All other components within normal limits  CBC - Abnormal; Notable for the following:    WBC 18.5 (*)    RBC 3.81 (*)    Hemoglobin 11.5 (*)    HCT 34.9 (*)    All other components within normal limits  BASIC METABOLIC PANEL - Abnormal; Notable for the following:    Potassium 3.2 (*)    Glucose, Bld 192 (*)    Calcium 8.5 (*)    All other components within normal limits  URINE MICROSCOPIC-ADD ON - Abnormal; Notable for the following:    Squamous Epithelial / LPF 6-30 (*)    Bacteria, UA MANY (*)    All other components within normal limits  URINE CULTURE  POC URINE PREG, ED    EKG  EKG Interpretation None       Radiology No results found.  Procedures Procedures (including critical care time)  Medications Ordered in ED Medications  morphine 2 MG/ML injection 4 mg (not administered)  acetaminophen (TYLENOL) tablet 650 mg (650 mg Oral Given 03/20/16 1936)  sodium chloride 0.9 % bolus 2,000 mL  (0 mLs Intravenous Stopped 03/20/16 2356)  potassium chloride SA (K-DUR,KLOR-CON) CR tablet 40 mEq (40 mEq Oral Given 03/20/16 2126)  morphine 2 MG/ML injection 4 mg (4 mg Intravenous Given 03/20/16 2135)  cefTRIAXone (ROCEPHIN) 1 g in dextrose 5 % 50 mL IVPB (0 g Intravenous Stopped 03/20/16 2204)  ibuprofen (ADVIL,MOTRIN) tablet 400 mg (400 mg Oral Given 03/20/16 2142)  sodium chloride 0.9 % bolus 1,000 mL (1,000 mLs Intravenous New Bag/Given 03/21/16 0007)     Initial Impression / Assessment and Plan / ED Course  I have reviewed the triage vital signs and the nursing notes.  Pertinent labs & imaging results that were available during my care of the patient were reviewed by me and considered in my medical decision making (see chart for details).  Clinical Course   30 y.o. female here with R flank pain x5 days, fever that began today. Some dysuria, urinary hesitancy, and urgency. Fever 100.8 here, mildly tachycardic, R flank TTP, R abd TTP tracking towards the flank from the suprapubic area. Overall appears uncomfortable but stable, doubt need to call code sepsis but will start fluids and rocephin. Given tylenol on arrival, will recheck temp now. CBC with leukocytosis 18.5 and stable chronic anemia. BMP with mildly low K 3.2, will give kdur now. Gluc 192, which is odd since she doesn't have DM2 and sees a PCP regularly; could be lab error. Upreg neg. U/A with large leuks, TNTC WBC and many bacteria, consistent with pyelonephritis. Culture already sent. Will give ibuprofen now since she only got a half dose earlier; start rocephin and give kdur, morphine, and fluids. Will reassess after, if pt feeling better with fluids/etc, then maybe can go home; if not greatly improved, will need to be admitted.   11:52 PM Pt's fever improved, HR improved after fluids and antipyretics, BP soft but stable, will add on another 1L bolus. Pt feeling slightly better but still not great, discussed options at length of  trying more pain meds and d/c home for outpatient management, vs just proceeding with inpatient management of pyelonephritis. Pt willing to be admitted. Will proceed with admit, give some pain meds and more fluids. She has already gotten 1 dose of antibiotics, so blood cultures wouldn't be as helpful at this point, will  hold off getting them.  12:08 AM Dr. Mickle Mallory of [facility] returning page and will admit. Holding orders placed. Please see their notes for further documentation of care. I appreciate their help with this pleasant pt's care. Pt stable at time of admission.   I personally performed the services described in this documentation, which was scribed in my presence. The recorded information has been reviewed and is accurate.   Final Clinical Impressions(s) / ED Diagnoses   Final diagnoses:  Pyelonephritis  Leukocytosis, unspecified type  Hypokalemia  Right flank pain  Fever, unspecified fever cause  Chronic anemia    New Prescriptions New Prescriptions   No medications on file     Milta Croson Camprubi-Soms, PA-C 03/21/16 0008    Lyndal Pulley, MD 03/21/16 [phone]

## 2016-03-21 ENCOUNTER — Observation Stay (HOSPITAL_COMMUNITY): Payer: BLUE CROSS/BLUE SHIELD

## 2016-03-21 ENCOUNTER — Encounter (HOSPITAL_COMMUNITY): Payer: Self-pay | Admitting: *Deleted

## 2016-03-21 DIAGNOSIS — E876 Hypokalemia: Secondary | ICD-10-CM

## 2016-03-21 DIAGNOSIS — N12 Tubulo-interstitial nephritis, not specified as acute or chronic: Secondary | ICD-10-CM | POA: Diagnosis not present

## 2016-03-21 DIAGNOSIS — A419 Sepsis, unspecified organism: Secondary | ICD-10-CM

## 2016-03-21 LAB — BASIC METABOLIC PANEL
Anion gap: 7 (ref 5–15)
CHLORIDE: 114 mmol/L — AB (ref 101–111)
CO2: 20 mmol/L — AB (ref 22–32)
CREATININE: 0.48 mg/dL (ref 0.44–1.00)
Calcium: 7.8 mg/dL — ABNORMAL LOW (ref 8.9–10.3)
GFR calc Af Amer: >60 mL/min (ref >60)
GFR calc non Af Amer: >60 mL/min (ref >60)
Glucose, Bld: 111 mg/dL — ABNORMAL HIGH (ref 65–99)
POTASSIUM: 3.4 mmol/L — AB (ref 3.5–5.1)
Sodium: 141 mmol/L (ref 135–145)

## 2016-03-21 LAB — CBC
HEMATOCRIT: 31.5 % — AB (ref 36.0–46.0)
Hemoglobin: 10.5 g/dL — ABNORMAL LOW (ref 12.0–15.0)
MCH: 30.6 pg (ref 26.0–34.0)
MCHC: 33.3 g/dL (ref 30.0–36.0)
MCV: 91.8 fL (ref 78.0–100.0)
PLATELETS: 201 10*3/uL (ref 150–400)
RBC: 3.43 MIL/uL — AB (ref 3.87–5.11)
RDW: 13.2 % (ref 11.5–15.5)
WBC: 17.9 10*3/uL — ABNORMAL HIGH (ref 4.0–10.5)

## 2016-03-21 LAB — MAGNESIUM: Magnesium: 1.7 mg/dL (ref 1.7–2.4)

## 2016-03-21 MED ORDER — NORGESTIMATE-ETH ESTRADIOL 0.25-35 MG-MCG PO TABS
1.0000 | ORAL_TABLET | Freq: Every day | ORAL | Status: DC
  Administered 2016-03-21 – 2016-03-22 (×2): 1 via ORAL

## 2016-03-21 MED ORDER — CYCLOBENZAPRINE HCL 10 MG PO TABS
10.0000 mg | ORAL_TABLET | Freq: Three times a day (TID) | ORAL | Status: DC | PRN
  Administered 2016-03-21 (×2): 10 mg via ORAL
  Filled 2016-03-21 (×2): qty 1

## 2016-03-21 MED ORDER — SODIUM CHLORIDE 0.9 % IV SOLN
INTRAVENOUS | Status: DC
  Administered 2016-03-21 (×2): via INTRAVENOUS

## 2016-03-21 MED ORDER — OXYCODONE HCL 5 MG PO TABS
10.0000 mg | ORAL_TABLET | ORAL | Status: DC
  Administered 2016-03-21 – 2016-03-23 (×10): 10 mg via ORAL
  Filled 2016-03-21 (×10): qty 2

## 2016-03-21 MED ORDER — ENOXAPARIN SODIUM 40 MG/0.4ML ~~LOC~~ SOLN
40.0000 mg | Freq: Every day | SUBCUTANEOUS | Status: DC
  Administered 2016-03-21 – 2016-03-22 (×2): 40 mg via SUBCUTANEOUS
  Filled 2016-03-21 (×2): qty 0.4

## 2016-03-21 MED ORDER — MORPHINE SULFATE (PF) 2 MG/ML IV SOLN
2.0000 mg | INTRAVENOUS | Status: DC | PRN
  Administered 2016-03-21 (×2): 2 mg via INTRAVENOUS
  Filled 2016-03-21 (×3): qty 1

## 2016-03-21 MED ORDER — HYDROCODONE-ACETAMINOPHEN 5-325 MG PO TABS
1.0000 | ORAL_TABLET | Freq: Four times a day (QID) | ORAL | Status: DC | PRN
  Administered 2016-03-21: 1 via ORAL
  Filled 2016-03-21: qty 1

## 2016-03-21 MED ORDER — HYDROCODONE-ACETAMINOPHEN 5-325 MG PO TABS
2.0000 | ORAL_TABLET | Freq: Four times a day (QID) | ORAL | Status: DC
  Administered 2016-03-21 (×2): 2 via ORAL
  Filled 2016-03-21 (×2): qty 2

## 2016-03-21 MED ORDER — PRENATAL MULTIVITAMIN CH
1.0000 | ORAL_TABLET | Freq: Every day | ORAL | Status: DC
  Administered 2016-03-21 – 2016-03-22 (×2): 1 via ORAL
  Filled 2016-03-21 (×3): qty 1

## 2016-03-21 MED ORDER — BUPROPION HCL ER (SR) 150 MG PO TB12
150.0000 mg | ORAL_TABLET | Freq: Two times a day (BID) | ORAL | Status: DC
  Administered 2016-03-22: 150 mg via ORAL
  Filled 2016-03-21 (×4): qty 1

## 2016-03-21 MED ORDER — METHYLPHENIDATE HCL 10 MG PO TABS
60.0000 mg | ORAL_TABLET | Freq: Every day | ORAL | Status: DC
  Administered 2016-03-21 – 2016-03-23 (×3): 60 mg via ORAL
  Filled 2016-03-21 (×4): qty 6

## 2016-03-21 MED ORDER — POTASSIUM CHLORIDE CRYS ER 20 MEQ PO TBCR
40.0000 meq | EXTENDED_RELEASE_TABLET | ORAL | Status: AC
Start: 2016-03-21 — End: 2016-03-21
  Administered 2016-03-21 (×2): 40 meq via ORAL
  Filled 2016-03-21 (×2): qty 2

## 2016-03-21 MED ORDER — METHYLPHENIDATE HCL 5 MG PO TABS: 60.0000 mg | ORAL_TABLET | Freq: Every day | ORAL | Status: DC

## 2016-03-21 MED ORDER — ACETAMINOPHEN 325 MG PO TABS
650.0000 mg | ORAL_TABLET | Freq: Four times a day (QID) | ORAL | Status: AC | PRN
  Administered 2016-03-21: 650 mg via ORAL
  Filled 2016-03-21: qty 2

## 2016-03-21 MED ORDER — DEXTROSE 5 % IV SOLN
1.0000 g | INTRAVENOUS | Status: DC
  Administered 2016-03-21 – 2016-03-22 (×2): 1 g via INTRAVENOUS
  Filled 2016-03-21 (×2): qty 10

## 2016-03-21 NOTE — H&P (Signed)
History and Physical  Gloria Weber XBM:[id number] DOB: 1986/04/21 DOA: 03/20/2016  PCP:  Kaleen MaskELKINS,WILSON OLIVER, MD   Chief Complaint:  R.flank pain   History of Present Illness:  Patient is a 30 yo female with history of sciatica, dysfunctional uterine bleeding came with cc of R.flank pain for the past 5 days associated with dysuria and fever. Due to DUB she can't tell if she has true hematuria. Pain is radiating toward groin, not associated with N/V/D/C. No chest pain/dyspnea/cough. No other complaints.   Review of Systems:  CONSTITUTIONAL:     No night sweats.  No fatigue.  +fever. No chills. Eyes:                            No visual changes.  No eye pain.  No eye discharge.   ENT:                              No epistaxis.  No sinus pain.  No sore throat.   No congestion. RESPIRATORY:           No cough.  No wheeze.  No hemoptysis.  No dyspnea CARDIOVASCULAR   :  No chest pains.  No palpitations. GASTROINTESTINAL:  No abdominal pain.  No nausea. No vomiting.  No diarrhea. No   constipation.  No hematemesis.  No hematochezia.  No melena. GENITOURINARY:      +urgency.  No frequency.  +dysuria.  +hematuria.  No  obstructive symptoms.  No discharge.  +pain.   MUSCULOSKELETAL:  +musculoskeletal pain.  No joint swelling.  No arthritis. NEUROLOGICAL:        No confusion.  No weakness. No headache. No seizure. PSYCHIATRIC:             No depression. No anxiety. No suicidal ideation. SKIN:                             No rashes.  No lesions.  No wounds. ENDOCRINE:                No weight loss.  No polydipsia. .  No polyphagia. HEMATOLOGIC:           No purpura.  No petechiae.  No bleeding.  ALLERGIC                 : No pruritus.  No angioedema Other:  Past Medical and Surgical History:   Past Medical History:  Diagnosis Date  . Abnormal Pap smear   . Pilonidal cyst 2010   Past Surgical History:  Procedure Laterality Date  . CERVICAL CONIZATION W/BX  2010  .  DILATION AND CURETTAGE OF UTERUS    . LEEP  2010  . PILONIDAL CYST EXCISION  2010  . TONSILLECTOMY      Social History:   reports that she has been smoking.  She has been smoking about 0.25 packs per day. She has never used smokeless tobacco. She reports that she does not drink alcohol or use drugs.    Allergies  Allergen Reactions  . Nsaids Other (See Comments)    Ibuprofen caused excessive nosebleeding.  Avoids all NSAIDS.  Acetaminophen OK    Family History  Problem Relation Age of Onset  . Heart disease Father   . Diabetes Maternal Grandmother       Prior to Admission medications  Medication Sig Start Date End Date Taking? Authorizing Provider  HYDROcodone-acetaminophen (NORCO/VICODIN) 5-325 MG tablet Take 1 tablet by mouth every 6 (six) hours as needed for moderate pain or severe pain.   Yes Historical Provider, MD  ibuprofen (ADVIL,MOTRIN) 200 MG tablet Take 400 mg by mouth every 6 (six) hours as needed for fever or moderate pain.   Yes Historical Provider, MD  methylphenidate (RITALIN) 20 MG tablet Take 60 mg by mouth daily.  02/18/16  Yes Historical Provider, MD  SPRINTEC 28 0.25-35 MG-MCG tablet Take 1 tablet by mouth daily.  02/23/16  Yes Historical Provider, MD  traMADol (ULTRAM) 50 MG tablet Take 50 mg by mouth every 6 (six) hours as needed for moderate pain.    Yes Historical Provider, MD  buPROPion (WELLBUTRIN SR) 150 MG 12 hr tablet Take 150 mg by mouth 2 (two) times daily.      Historical Provider, MD  cyclobenzaprine (FLEXERIL) 10 MG tablet Take 10 mg by mouth 3 (three) times daily as needed.    Historical Provider, MD  HYDROcodone-acetaminophen (NORCO/VICODIN) 5-325 MG per tablet Take 1 tablet by mouth once. Patient not taking: Reported on 03/20/2016 03/02/12   Johnsie Kindredarmen L Chatten, NP  medroxyPROGESTERone (DEPO-PROVERA) 150 MG/ML injection Inject 1 mL (150 mg total) into the muscle once. Patient not taking: Reported on 03/20/2016 01/24/11   Silverio LaySandra Rivard, MD    methylPREDNISolone (MEDROL DOSEPAK) 4 MG tablet Take as directed Patient not taking: Reported on 03/20/2016 03/02/12   Johnsie Kindredarmen L Chatten, NP  prenatal vitamin w/FE, FA (PRENATAL 1 + 1) 27-1 MG TABS Take 1 tablet by mouth daily.      Historical Provider, MD    Physical Exam: BP (!) 85/52 (BP Location: Right Arm)   Pulse 96   Temp 98 F (36.7 C) (Oral)   Resp 16   Ht 5' (1.524 m)   Wt 70.8 kg (156 lb 1.6 oz)   SpO2 99%   BMI 30.49 kg/m   GENERAL :   Alert and cooperative, and appears to be in no acute distress. HEAD:           normocephalic. EYES:            PERRL, EOMI.   EARS:           hearing grossly intact. NOSE:           No nasal discharge. NECK:          supple CARDIAC:    Normal S1 and S2. No gallop. No murmurs.  Vascular:     no peripheral edema.  LUNGS:       Clear to auscultation  ABDOMEN: Positive bowel sounds. Soft, nondistended, RLQ tenderness, R.CVA tenderness. No guarding or rebound.      MSK:           No joint erythema or tenderness.  EXT           : No significant deformity or joint abnormality. Neuro        : Alert, oriented to person, place, and time. SKIN:            No rash. No lesions. PSYCH:       No hallucination. Patient is not suicidal.          Labs on Admission:  Reviewed.   Radiological Exams on Admission: No results found.   Assessment/Plan  Sepsis due to pyelonephritis : Ucx sent Started on Ceftriaxone  Given IVF, continue 125 cc/hr Norco prn pain Will  check renal US ( no hx of stones)  Hypokalemia: replaced, will monitor.   Chronic sciatica/MSK pain: continue Norco  Dysfunctional uterine bleeding: continue OCP.  Input & Output: na Lines & Tubes: PIV DVT prophylaxis: [place] enoxaparin  GI prophylaxis: na Consultants: na Code Status: full  Family Communication: at bedside  Disposition Plan: Obs    Eston Esters M.D [facility]

## 2016-03-21 NOTE — Progress Notes (Signed)
Pharmacy Antibiotic Note  Gloria Weber is a 30 y.o. female admitted on 03/20/2016 with UTI.  Pharmacy has been consulted for rocephin dosing.  Plan: Rocephin 1gm IV q24h  Height: 5' (152.4 cm) Weight: 156 lb 1.6 oz (70.8 kg) IBW/kg (Calculated) : 45.5  Temp (24hrs), Avg:99 F (37.2 C), Min:98 F (36.7 C), Max:100.8 F (38.2 C)   Recent Labs Lab 03/20/16 2026  WBC 18.5*  CREATININE 0.72    Estimated Creatinine Clearance: 88.6 mL/min (by C-G formula based on SCr of 0.72 mg/dL).    Allergies  Allergen Reactions  . Nsaids Other (See Comments)    Ibuprofen caused excessive nosebleeding.  Avoids all NSAIDS.  Acetaminophen OK    Antimicrobials this admission: 10/31 rocephin >>    >>   Dose adjustments this admission:   Microbiology results:  BCx:   UCx:    Sputum:    MRSA PCR:   Thank you for allowing pharmacy to be a part of this patient's care. Rx will sign off as no further dose adjustments are necessary.  Lorenza EvangelistGreen, Gloria Weber 03/21/2016 12:42 AM

## 2016-03-21 NOTE — Progress Notes (Signed)
PROGRESS NOTE    Gloria Weber  ZOX:[id number] DOB: Dec 21, 1985 DOA: 03/20/2016  PCP: Kaleen MaskELKINS,WILSON OLIVER, MD   Brief Narrative:  Patient is a 30 yo female with history of sciatica, dysfunctional uterine presents with R.flank pain for 5 days associated with dysuria and fever. Due to DUB she can't tell if she has true hematuria. Pain is radiating toward groin. UA + . Admitted for pyelonephritis.   Subjective: Pain in right abdomen and flank. Nausea.   Assessment & Plan:   Principal Problem:     Sepsis due to Pyelonephritis - fever 102, WBC 18.5, BP 85/52 at one point - quite tender in right abdomen and flank - KUB negative for nephrolithiasis - cont Rocephin, f/u cultures, increase pain medications for optimal pain control  Active Problems:     Hypokalemia - Mg 1.7- replacing K  Anemic - check anemia panel   DVT prophylaxis: Lovenox Code Status: Full code  Family Communication:  Disposition Plan: home when stable Consultants:    Procedures:    Antimicrobials:  Anti-infectives    Start     Dose/Rate Route Frequency Ordered Stop   03/21/16 2200  cefTRIAXone (ROCEPHIN) 1 g in dextrose 5 % 50 mL IVPB     1 g 100 mL/hr over 30 Minutes Intravenous Every 24 hours 03/21/16 0043     03/20/16 2130  cefTRIAXone (ROCEPHIN) 1 g in dextrose 5 % 50 mL IVPB     1 g 100 mL/hr over 30 Minutes Intravenous  Once 03/20/16 2118 03/20/16 2204       Objective: Vitals:   03/20/16 2233 03/20/16 2351 03/21/16 0111 03/21/16 0558  BP: (!) 85/56 (!) 85/52 (!) 85/55 112/75  Pulse: 95 96 90 95  Resp: 18 16 16 15   Temp: 98.3 F (36.8 C) 98 F (36.7 C) 98.9 F (37.2 C) (!) 102 F (38.9 C)  TempSrc: Oral Oral Oral Oral  SpO2: 100% 99% 96% 95%  Weight:   70.8 kg (156 lb)   Height:   5' (1.524 m)     Intake/Output Summary (Last 24 hours) at 03/21/16 1257 Last data filed at 03/21/16 1252  Gross per 24 hour  Intake          2677.08 ml  Output             3100 ml  Net           -422.92 ml   Filed Weights   03/20/16 1914 03/21/16 0111  Weight: 70.8 kg (156 lb 1.6 oz) 70.8 kg (156 lb)    Examination: General exam: Appears uncomfortable  HEENT: PERRLA, oral mucosa moist, no sclera icterus or thrush Respiratory system: Clear to auscultation. Respiratory effort normal. Cardiovascular system: S1 & S2 heard, RRR.  No murmurs  Gastrointestinal system: Abdomen soft, significantly tender r abdomen and r flank, nondistended. Normal bowel sound. No organomegaly Central nervous system: Alert and oriented. No focal neurological deficits. Extremities: No cyanosis, clubbing or edema Skin: No rashes or ulcers Psychiatry:  Mood & affect appropriate.     Data Reviewed: I have personally reviewed following labs and imaging studies  CBC:  Recent Labs Lab 03/20/16 2026 03/21/16 0413  WBC 18.5* 17.9*  HGB 11.5* 10.5*  HCT 34.9* 31.5*  MCV 91.6 91.8  PLT 223 201   Basic Metabolic Panel:  Recent Labs Lab 03/20/16 2026 03/21/16 0413  NA 135 141  K 3.2* 3.4*  CL 102 114*  CO2 25 20*  GLUCOSE 192* 111*  BUN 7 <5*  CREATININE 0.72 0.48  CALCIUM 8.5* 7.8*  MG  --  1.7   GFR: Estimated Creatinine Clearance: 88.6 mL/min (by C-G formula based on SCr of 0.48 mg/dL). Liver Function Tests: No results for input(s): AST, ALT, ALKPHOS, BILITOT, PROT, ALBUMIN in the last 168 hours. No results for input(s): LIPASE, AMYLASE in the last 168 hours. No results for input(s): AMMONIA in the last 168 hours. Coagulation Profile: No results for input(s): INR, PROTIME in the last 168 hours. Cardiac Enzymes: No results for input(s): CKTOTAL, CKMB, CKMBINDEX, TROPONINI in the last 168 hours. BNP (last 3 results) No results for input(s): PROBNP in the last 8760 hours. HbA1C: No results for input(s): HGBA1C in the last 72 hours. CBG: No results for input(s): GLUCAP in the last 168 hours. Lipid Profile: No results for input(s): CHOL, HDL, LDLCALC, TRIG, CHOLHDL, LDLDIRECT in  the last 72 hours. Thyroid Function Tests: No results for input(s): TSH, T4TOTAL, FREET4, T3FREE, THYROIDAB in the last 72 hours. Anemia Panel: No results for input(s): VITAMINB12, FOLATE, FERRITIN, TIBC, IRON, RETICCTPCT in the last 72 hours. Urine analysis:    Component Value Date/Time   COLORURINE YELLOW 03/20/2016 1932   APPEARANCEUR CLOUDY (A) 03/20/2016 1932   LABSPEC 1.006 03/20/2016 1932   PHURINE 6.0 03/20/2016 1932   GLUCOSEU NEGATIVE 03/20/2016 1932   HGBUR MODERATE (A) 03/20/2016 1932   BILIRUBINUR NEGATIVE 03/20/2016 1932   KETONESUR NEGATIVE 03/20/2016 1932   PROTEINUR 30 (A) 03/20/2016 1932   UROBILINOGEN 1.0 03/02/2012 1302   NITRITE NEGATIVE 03/20/2016 1932   LEUKOCYTESUR LARGE (A) 03/20/2016 1932   Sepsis Labs: @LABRCNTIP (procalcitonin:4,lacticidven:4) )No results found for this or any previous visit (from the past 240 hour(s)).       Radiology Studies: Dg Abd 1 View  Result Date: 03/21/2016 CLINICAL DATA:  Dysfunctional uterine bleeding. Right flank pain for 5 days. EXAM: ABDOMEN - 1 VIEW COMPARISON:  None. FINDINGS: The bowel gas pattern is normal. No radio-opaque calculi or other significant radiographic abnormality are seen. The axial skeleton is within normal limits. IMPRESSION: 1. Normal bowel gas pattern. 2. No focal radiopaque stone projected over either kidney or expected ureter tract. Electronically Signed   By: Marin Robertshristopher  Mattern M.D.   On: 03/21/2016 10:30      Scheduled Meds: . buPROPion  150 mg Oral BID  . cefTRIAXone (ROCEPHIN)  IV  1 g Intravenous Q24H  . enoxaparin (LOVENOX) injection  40 mg Subcutaneous Daily  . HYDROcodone-acetaminophen  2 tablet Oral Q6H  . methylphenidate  60 mg Oral Daily  . norgestimate-ethinyl estradiol  1 tablet Oral Daily  . prenatal multivitamin  1 tablet Oral Daily   Continuous Infusions: . sodium chloride 125 mL/hr at 03/21/16 0147     LOS: 0 days    Time spent in minutes: 35    Jeanni Allshouse,  MD [facility] Pager: [email or web address] Password [email or web address] 03/21/2016, 12:57 PM

## 2016-03-21 NOTE — Progress Notes (Signed)
   03/21/16 1000  Clinical Encounter Type  Visited With Patient  Visit Type Initial;Psychological support;Spiritual support  Referral From Nurse  Consult/Referral To Chaplain  Spiritual Encounters  Spiritual Needs Emotional;Other (Comment) (Pastoral Conversation/Support)  Stress Factors  Patient Stress Factors Other (Comment);Health changes (Pain)   I visited with the patient per Spiritual Care consult referral by the nurse. When I arrived the patient was bundled up in bed and stated that "it hurts to shiver." The patient stated that she was in a great deal of pain and unable to engage in conversation. I offered to come back at a later time and she was receptive to that.  I will follow-up with this patient at a later time.   Please, contact Spiritual Care for further assistance.   Chaplain Clint BolderBrittany Day Greb M.Div.

## 2016-03-21 NOTE — ED Notes (Signed)
Pt assisted to BR.

## 2016-03-21 NOTE — Progress Notes (Signed)
Temp of 102, new orders noted.

## 2016-03-22 DIAGNOSIS — A419 Sepsis, unspecified organism: Principal | ICD-10-CM

## 2016-03-22 DIAGNOSIS — E876 Hypokalemia: Secondary | ICD-10-CM

## 2016-03-22 DIAGNOSIS — D509 Iron deficiency anemia, unspecified: Secondary | ICD-10-CM

## 2016-03-22 LAB — FOLATE: Folate: 9.4 ng/mL (ref >5.9)

## 2016-03-22 LAB — BASIC METABOLIC PANEL
Anion gap: 7 (ref 5–15)
CO2: 20 mmol/L — ABNORMAL LOW (ref 22–32)
CREATININE: 0.51 mg/dL (ref 0.44–1.00)
Calcium: 8.2 mg/dL — ABNORMAL LOW (ref 8.9–10.3)
Chloride: 111 mmol/L (ref 101–111)
GFR calc Af Amer: >60 mL/min (ref >60)
Glucose, Bld: 111 mg/dL — ABNORMAL HIGH (ref 65–99)
Potassium: 3.7 mmol/L (ref 3.5–5.1)
SODIUM: 138 mmol/L (ref 135–145)

## 2016-03-22 LAB — IRON AND TIBC
Iron: 8 ug/dL — ABNORMAL LOW (ref 28–170)
SATURATION RATIOS: 2 % — AB (ref 10.4–31.8)
TIBC: 323 ug/dL (ref 250–450)
UIBC: 315 ug/dL

## 2016-03-22 LAB — CBC
HCT: 31.4 % — ABNORMAL LOW (ref 36.0–46.0)
Hemoglobin: 10.3 g/dL — ABNORMAL LOW (ref 12.0–15.0)
MCH: 30.3 pg (ref 26.0–34.0)
MCHC: 32.8 g/dL (ref 30.0–36.0)
MCV: 92.4 fL (ref 78.0–100.0)
PLATELETS: 214 10*3/uL (ref 150–400)
RBC: 3.4 MIL/uL — ABNORMAL LOW (ref 3.87–5.11)
RDW: 13.4 % (ref 11.5–15.5)
WBC: 21.4 10*3/uL — ABNORMAL HIGH (ref 4.0–10.5)

## 2016-03-22 LAB — RETICULOCYTES
RBC.: 3.5 MIL/uL — AB (ref 3.87–5.11)
Retic Count, Absolute: 35 10*3/uL (ref 19.0–186.0)
Retic Ct Pct: 1 % (ref 0.4–3.1)

## 2016-03-22 LAB — URINE CULTURE: Culture: <10000 — AB

## 2016-03-22 LAB — FERRITIN: Ferritin: 90 ng/mL (ref 11–307)

## 2016-03-22 LAB — VITAMIN B12: VITAMIN B 12: 583 pg/mL (ref 180–914)

## 2016-03-22 MED ORDER — DICLOFENAC SODIUM 1 % TD GEL
2.0000 g | Freq: Four times a day (QID) | TRANSDERMAL | Status: DC
  Administered 2016-03-22 – 2016-03-23 (×3): 2 g via TOPICAL
  Filled 2016-03-22: qty 100

## 2016-03-22 NOTE — Progress Notes (Signed)
PROGRESS NOTE    Gloria Weber  [id number]:[id number] DOB: 1986/03/21 DOA: 03/20/2016  PCP: Kaleen Mask, MD   Brief Narrative:  Patient is a 30 yo female with history of sciatica, dysfunctional uterine presents with R.flank pain for 5 days associated with dysuria and fever. Due to DUB she can't tell if she has true hematuria. Pain is radiating toward groin. UA + . Admitted for pyelonephritis.   Subjective: Continues to have pain - more in right abdomen and less in R flank. Pain better controlled on Oxycodone.  Assessment & Plan:   Principal Problem:     Sepsis due to Pyelonephritis - fever 102, WBC 18.5, BP 85/52 at one point - quite tender in right abdomen and flank - KUB negative for nephrolithiasis - WBC up today but fever down - cont Rocephin, f/u cultures, increased pain medications for optimal pain control  Active Problems:     Hypokalemia - Mg 1.7- replaced K  Anemic - Hb 13 in the past and now 9-11 - checked anemia panel shows low Iron and normal Ferretin (may be acute phase reactant), IBC on high side - will start oral iron when able to tolerate it   DVT prophylaxis: Lovenox Code Status: Full code  Family Communication:  Disposition Plan: home when stable Consultants:    Procedures:    Antimicrobials:  Anti-infectives    Start     Dose/Rate Route Frequency Ordered Stop   03/21/16 2200  cefTRIAXone (ROCEPHIN) 1 g in dextrose 5 % 50 mL IVPB     1 g 100 mL/hr over 30 Minutes Intravenous Every 24 hours 03/21/16 0043     03/20/16 2130  cefTRIAXone (ROCEPHIN) 1 g in dextrose 5 % 50 mL IVPB     1 g 100 mL/hr over 30 Minutes Intravenous  Once 03/20/16 2118 03/20/16 2204       Objective: Vitals:   03/21/16 1332 03/21/16 2206 03/21/16 2300 03/22/16 0548  BP: 103/62 100/72  104/71  Pulse: (!) 102 99  95  Resp: 16 16    Temp: 98.9 F (37.2 C) (!) 100.5 F (38.1 C) 99 F (37.2 C) 99.2 F (37.3 C)  TempSrc: Oral Axillary Axillary Oral  SpO2:  99% 100%  100%  Weight:      Height:        Intake/Output Summary (Last 24 hours) at 03/22/16 1318 Last data filed at 03/22/16 1025  Gross per 24 hour  Intake          3474.17 ml  Output             3300 ml  Net           174.17 ml   Filed Weights   03/20/16 1914 03/21/16 0111  Weight: 70.8 kg (156 lb 1.6 oz) 70.8 kg (156 lb)    Examination: General exam: Appears uncomfortable  HEENT: PERRLA, oral mucosa moist, no sclera icterus or thrush Respiratory system: Clear to auscultation. Respiratory effort normal. Cardiovascular system: S1 & S2 heard, RRR.  No murmurs  Gastrointestinal system: Abdomen soft, significantly tender r abdomen and r flank, nondistended. Normal bowel sound. No organomegaly Central nervous system: Alert and oriented. No focal neurological deficits. Extremities: No cyanosis, clubbing or edema Skin: No rashes or ulcers Psychiatry:  Mood & affect appropriate.     Data Reviewed: I have personally reviewed following labs and imaging studies  CBC:  Recent Labs Lab 03/20/16 2026 03/21/16 0413 03/22/16 0350  WBC 18.5* 17.9* 21.4*  HGB 11.5* 10.5*  10.3*  HCT 34.9* 31.5* 31.4*  MCV 91.6 91.8 92.4  PLT 223 201 214   Basic Metabolic Panel:  Recent Labs Lab 03/20/16 2026 03/21/16 0413 03/22/16 0350  NA 135 141 138  K 3.2* 3.4* 3.7  CL 102 114* 111  CO2 25 20* 20*  GLUCOSE 192* 111* 111*  BUN 7 <5* <5*  CREATININE 0.72 0.48 0.51  CALCIUM 8.5* 7.8* 8.2*  MG  --  1.7  --    GFR: Estimated Creatinine Clearance: 88.6 mL/min (by C-G formula based on SCr of 0.51 mg/dL). Liver Function Tests: No results for input(s): AST, ALT, ALKPHOS, BILITOT, PROT, ALBUMIN in the last 168 hours. No results for input(s): LIPASE, AMYLASE in the last 168 hours. No results for input(s): AMMONIA in the last 168 hours. Coagulation Profile: No results for input(s): INR, PROTIME in the last 168 hours. Cardiac Enzymes: No results for input(s): CKTOTAL, CKMB, CKMBINDEX,  TROPONINI in the last 168 hours. BNP (last 3 results) No results for input(s): PROBNP in the last 8760 hours. HbA1C: No results for input(s): HGBA1C in the last 72 hours. CBG: No results for input(s): GLUCAP in the last 168 hours. Lipid Profile: No results for input(s): CHOL, HDL, LDLCALC, TRIG, CHOLHDL, LDLDIRECT in the last 72 hours. Thyroid Function Tests: No results for input(s): TSH, T4TOTAL, FREET4, T3FREE, THYROIDAB in the last 72 hours. Anemia Panel:  Recent Labs  03/22/16 0811  VITAMINB12 583  FOLATE 9.4  FERRITIN 90  TIBC 323  IRON 8*  RETICCTPCT 1.0   Urine analysis:    Component Value Date/Time   COLORURINE YELLOW 03/20/2016 1932   APPEARANCEUR CLOUDY (A) 03/20/2016 1932   LABSPEC 1.006 03/20/2016 1932   PHURINE 6.0 03/20/2016 1932   GLUCOSEU NEGATIVE 03/20/2016 1932   HGBUR MODERATE (A) 03/20/2016 1932   BILIRUBINUR NEGATIVE 03/20/2016 1932   KETONESUR NEGATIVE 03/20/2016 1932   PROTEINUR 30 (A) 03/20/2016 1932   UROBILINOGEN 1.0 03/02/2012 1302   NITRITE NEGATIVE 03/20/2016 1932   LEUKOCYTESUR LARGE (A) 03/20/2016 1932   Sepsis Labs: @LABRCNTIP (procalcitonin:4,lacticidven:4) ) Recent Results (from the past 240 hour(s))  Urine C&S     Status: Abnormal   Collection Time: 03/20/16  7:32 PM  Result Value Ref Range Status   Specimen Description URINE, CLEAN CATCH  Final   Special Requests NONE  Final   Culture (A)  Final    <10,000 COLONIES/mL INSIGNIFICANT GROWTH [facility]    Report Status 03/22/2016 FINAL  Final  Culture, blood (routine x 2)     Status: None (Preliminary result)   Collection Time: 03/21/16  6:19 AM  Result Value Ref Range Status   Specimen Description BLOOD RIGHT ANTECUBITAL  Final   Special Requests BOTTLES DRAWN AEROBIC AND ANAEROBIC 5CC  Final   Culture   Final    NO GROWTH < 24 HOURS [facility]    Report Status PENDING  Incomplete  Culture, blood (routine x 2)     Status: None  (Preliminary result)   Collection Time: 03/21/16  6:19 AM  Result Value Ref Range Status   Specimen Description BLOOD LEFT HAND  Final   Special Requests BOTTLES DRAWN AEROBIC AND ANAEROBIC 5CC  Final   Culture   Final    NO GROWTH < 24 HOURS [facility]    Report Status PENDING  Incomplete         Radiology Studies: Dg Abd 1 View  Result Date: 03/21/2016 CLINICAL DATA:  Dysfunctional uterine bleeding. Right flank pain for 5 days. EXAM: ABDOMEN - 1 VIEW COMPARISON:  None. FINDINGS: The bowel gas pattern is normal. No radio-opaque calculi or other significant radiographic abnormality are seen. The axial skeleton is within normal limits. IMPRESSION: 1. Normal bowel gas pattern. 2. No focal radiopaque stone projected over either kidney or expected ureter tract. Electronically Signed   By: Marin Robertshristopher  Mattern M.D.   On: 03/21/2016 10:30      Scheduled Meds: . buPROPion  150 mg Oral BID  . cefTRIAXone (ROCEPHIN)  IV  1 g Intravenous Q24H  . diclofenac sodium  2 g Topical QID  . enoxaparin (LOVENOX) injection  40 mg Subcutaneous Daily  . methylphenidate  60 mg Oral Daily  . norgestimate-ethinyl estradiol  1 tablet Oral Daily  . oxyCODONE  10 mg Oral Q4H  . prenatal multivitamin  1 tablet Oral Daily   Continuous Infusions: . sodium chloride 125 mL/hr at 03/22/16 0228     LOS: 0 days    Time spent in minutes: 35    Madalynn Pickelsimer, MD [facility] Pager: [email or web address] Password [email or web address] 03/22/2016, 1:18 PM

## 2016-03-23 DIAGNOSIS — D509 Iron deficiency anemia, unspecified: Secondary | ICD-10-CM

## 2016-03-23 DIAGNOSIS — D500 Iron deficiency anemia secondary to blood loss (chronic): Secondary | ICD-10-CM

## 2016-03-23 LAB — BASIC METABOLIC PANEL
Anion gap: 7 (ref 5–15)
CHLORIDE: 110 mmol/L (ref 101–111)
CO2: 25 mmol/L (ref 22–32)
CREATININE: 0.46 mg/dL (ref 0.44–1.00)
Calcium: 8.4 mg/dL — ABNORMAL LOW (ref 8.9–10.3)
GFR calc Af Amer: >60 mL/min (ref >60)
GFR calc non Af Amer: >60 mL/min (ref >60)
GLUCOSE: 88 mg/dL (ref 65–99)
POTASSIUM: 3.7 mmol/L (ref 3.5–5.1)
SODIUM: 142 mmol/L (ref 135–145)

## 2016-03-23 LAB — CBC
HCT: 31.7 % — ABNORMAL LOW (ref 36.0–46.0)
HEMOGLOBIN: 10.2 g/dL — AB (ref 12.0–15.0)
MCH: 29.9 pg (ref 26.0–34.0)
MCHC: 32.2 g/dL (ref 30.0–36.0)
MCV: 93 fL (ref 78.0–100.0)
Platelets: 210 10*3/uL (ref 150–400)
RBC: 3.41 MIL/uL — AB (ref 3.87–5.11)
RDW: 13.6 % (ref 11.5–15.5)
WBC: 10.7 10*3/uL — ABNORMAL HIGH (ref 4.0–10.5)

## 2016-03-23 MED ORDER — CEFPODOXIME PROXETIL 200 MG PO TABS: 200.0000 mg | ORAL_TABLET | Freq: Two times a day (BID) | ORAL | 0 refills | Status: DC

## 2016-03-23 MED ORDER — POLYETHYLENE GLYCOL 3350 17 G PO PACK: 17.0000 g | PACK | Freq: Two times a day (BID) | ORAL | 0 refills | Status: DC | PRN

## 2016-03-23 MED ORDER — OXYCODONE HCL 10 MG PO TABS: 5.0000 mg | ORAL_TABLET | Freq: Four times a day (QID) | ORAL | 0 refills | Status: DC | PRN

## 2016-03-23 MED ORDER — FERROUS SULFATE 325 (65 FE) MG PO TABS: 325.0000 mg | ORAL_TABLET | Freq: Two times a day (BID) | ORAL | 0 refills | Status: DC

## 2016-03-23 NOTE — Discharge Summary (Signed)
Physician Discharge Summary  Gloria Weber LKG:[id number] DOB: 03/03/1986 DOA: 03/20/2016  PCP: Kaleen MaskELKINS,WILSON OLIVER, MD  Admit date: 03/20/2016 Discharge date: 03/23/2016  Admitted From: home Disposition:  home   Recommendations for Outpatient Follow-up:  1. CBC in 2-3 months  Discharge Condition:  stable   CODE STATUS:  Full code   Diet recommendation:  Heart healthy Consultations:  nonde    Discharge Diagnoses:  Principal Problem:   Pyelonephritis Active Problems:   Sepsis (HCC)   Hypokalemia   Anemia, iron deficiency    Subjective: Pain much improved. Has a good appetite today.   Brief Summary: Patient is a 30 yo female with history of sciatica, dysfunctional uterine presents with R.flank pain for 5 days associated with dysuria and fever. Due to DUB she can't tell if she has true hematuria. Pain is radiating toward groin. UA + . Admitted for pyelonephritis.   Hospital Course:  Principal Problem:     Sepsis due to Pyelonephritis - fever 102, WBC 18.5, BP 85/52 at one point - quite tender in right abdomen and flank - KUB negative for nephrolithiasis - WBC Improving and fever resolved- pain nearly resolved - blood cultures negative- U culture < 10,000 insignificant growth  - transition Rocephin to Vantin for total of 2 wks  Active Problems:     Hypokalemia - Mg 1.7- replaced K  Anemia- Iron deficiency - admits to heavy periods, does not eat much meet - Hb 13 in the past and now 9-11 - checked anemia panel shows low Iron and normal Ferretin (may be acute phase reactant), IBC on high side - Folic acid, B12 normal - will start oral iron   Iron/TIBC/Ferritin/ %Sat    Component Value Date/Time   IRON 8 (L) 03/22/2016 0811   TIBC 323 03/22/2016 0811   FERRITIN 90 03/22/2016 0811   IRONPCTSAT 2 (L) 03/22/2016 0811    Discharge Instructions  Discharge Instructions    Call MD for:  persistant nausea and vomiting    Complete by:  As directed    Call  MD for:  severe uncontrolled pain    Complete by:  As directed    Call MD for:  temperature >100.4    Complete by:  As directed    Diet - low sodium heart healthy    Complete by:  As directed    Increase activity slowly    Complete by:  As directed        Medication List    TAKE these medications   cefpodoxime 200 MG tablet Commonly known as:  VANTIN Take 1 tablet (200 mg total) by mouth 2 (two) times daily.   ferrous sulfate 325 (65 FE) MG tablet Take 1 tablet (325 mg total) by mouth 2 (two) times daily with a meal.   ibuprofen 200 MG tablet Commonly known as:  ADVIL,MOTRIN Take 400 mg by mouth every 6 (six) hours as needed for fever or moderate pain.   methylphenidate 20 MG tablet Commonly known as:  RITALIN Take 60 mg by mouth daily.   Oxycodone HCl 10 MG Tabs Take 0.5-1 tablets (5-10 mg total) by mouth every 6 (six) hours as needed for severe pain.   polyethylene glycol packet Commonly known as:  MIRALAX Take 17 g by mouth 2 (two) times daily as needed for mild constipation. Can take up to 3 x day as needed   SPRINTEC 28 0.25-35 MG-MCG tablet Generic drug:  norgestimate-ethinyl estradiol Take 1 tablet by mouth daily.   traMADol 50  MG tablet Commonly known as:  ULTRAM Take 50 mg by mouth every 6 (six) hours as needed for moderate pain.       Allergies  Allergen Reactions  . Nsaids Other (See Comments)    Ibuprofen caused excessive nosebleeding.  Avoids all NSAIDS.  Acetaminophen OK     Procedures/Studies:  Dg Abd 1 View  Result Date: 03/21/2016 CLINICAL DATA:  Dysfunctional uterine bleeding. Right flank pain for 5 days. EXAM: ABDOMEN - 1 VIEW COMPARISON:  None. FINDINGS: The bowel gas pattern is normal. No radio-opaque calculi or other significant radiographic abnormality are seen. The axial skeleton is within normal limits. IMPRESSION: 1. Normal bowel gas pattern. 2. No focal radiopaque stone projected over either kidney or expected ureter tract.  Electronically Signed   By: Marin Robertshristopher  Mattern M.D.   On: 03/21/2016 10:30       Discharge Exam: Vitals:   03/22/16 2149 03/23/16 0535  BP: 108/76 99/69  Pulse: 89 94  Resp: 20 20  Temp: 99.3 F (37.4 C) 99 F (37.2 C)   Vitals:   03/22/16 0548 03/22/16 1546 03/22/16 2149 03/23/16 0535  BP: 104/71 114/80 108/76 99/69  Pulse: 95 (!) 115 89 94  Resp:  18 20 20   Temp: 99.2 F (37.3 C) 99.5 F (37.5 C) 99.3 F (37.4 C) 99 F (37.2 C)  TempSrc: Oral Oral Oral Oral  SpO2: 100% 98% 100% 98%  Weight:      Height:        General: Pt is alert, awake, not in acute distress Cardiovascular: RRR, S1/S2 +, no rubs, no gallops Respiratory: CTA bilaterally, no wheezing, no rhonchi Abdominal: Soft, NT, ND, bowel sounds + Extremities: no edema, no cyanosis    The results of significant diagnostics from this hospitalization (including imaging, microbiology, ancillary and laboratory) are listed below for reference.     Microbiology: Recent Results (from the past 240 hour(s))  Urine C&S     Status: Abnormal   Collection Time: 03/20/16  7:32 PM  Result Value Ref Range Status   Specimen Description URINE, CLEAN CATCH  Final   Special Requests NONE  Final   Culture (A)  Final    <10,000 COLONIES/mL INSIGNIFICANT GROWTH [facility]    Report Status 03/22/2016 FINAL  Final  Culture, blood (routine x 2)     Status: None (Preliminary result)   Collection Time: 03/21/16  6:19 AM  Result Value Ref Range Status   Specimen Description BLOOD RIGHT ANTECUBITAL  Final   Special Requests BOTTLES DRAWN AEROBIC AND ANAEROBIC 5CC  Final   Culture   Final    NO GROWTH < 24 HOURS [facility]    Report Status PENDING  Incomplete  Culture, blood (routine x 2)     Status: None (Preliminary result)   Collection Time: 03/21/16  6:19 AM  Result Value Ref Range Status   Specimen Description BLOOD LEFT HAND  Final   Special Requests BOTTLES DRAWN AEROBIC  AND ANAEROBIC 5CC  Final   Culture   Final    NO GROWTH < 24 HOURS [facility]formed at [facility]    Report Status PENDING  Incomplete     Labs: BNP (last 3 results) No results for input(s): BNP in the last 8760 hours. Basic Metabolic Panel:  Recent Labs Lab 03/20/16 2026 03/21/16 0413 03/22/16 0350 03/23/16 0826  NA 135 141 138 142  K 3.2* 3.4* 3.7 3.7  CL 102 114* 111 110  CO2  25 20* 20* 25  GLUCOSE 192* 111* 111* 88  BUN 7 <5* <5* <5*  CREATININE 0.72 0.48 0.51 0.46  CALCIUM 8.5* 7.8* 8.2* 8.4*  MG  --  1.7  --   --    Liver Function Tests: No results for input(s): AST, ALT, ALKPHOS, BILITOT, PROT, ALBUMIN in the last 168 hours. No results for input(s): LIPASE, AMYLASE in the last 168 hours. No results for input(s): AMMONIA in the last 168 hours. CBC:  Recent Labs Lab 03/20/16 2026 03/21/16 0413 03/22/16 0350 03/23/16 0826  WBC 18.5* 17.9* 21.4* 10.7*  HGB 11.5* 10.5* 10.3* 10.2*  HCT 34.9* 31.5* 31.4* 31.7*  MCV 91.6 91.8 92.4 93.0  PLT 223 201 214 210   Cardiac Enzymes: No results for input(s): CKTOTAL, CKMB, CKMBINDEX, TROPONINI in the last 168 hours. BNP: Invalid input(s): POCBNP CBG: No results for input(s): GLUCAP in the last 168 hours. D-Dimer No results for input(s): DDIMER in the last 72 hours. Hgb A1c No results for input(s): HGBA1C in the last 72 hours. Lipid Profile No results for input(s): CHOL, HDL, LDLCALC, TRIG, CHOLHDL, LDLDIRECT in the last 72 hours. Thyroid function studies No results for input(s): TSH, T4TOTAL, T3FREE, THYROIDAB in the last 72 hours.  Invalid input(s): FREET3 Anemia work up  Recent Labs  03/22/16 0811  VITAMINB12 583  FOLATE 9.4  FERRITIN 90  TIBC 323  IRON 8*  RETICCTPCT 1.0   Urinalysis    Component Value Date/Time   COLORURINE YELLOW 03/20/2016 1932   APPEARANCEUR CLOUDY (A) 03/20/2016 1932   LABSPEC 1.006 03/20/2016 1932   PHURINE 6.0 03/20/2016 1932   GLUCOSEU NEGATIVE 03/20/2016 1932    HGBUR MODERATE (A) 03/20/2016 1932   BILIRUBINUR NEGATIVE 03/20/2016 1932   KETONESUR NEGATIVE 03/20/2016 1932   PROTEINUR 30 (A) 03/20/2016 1932   UROBILINOGEN 1.0 03/02/2012 1302   NITRITE NEGATIVE 03/20/2016 1932   LEUKOCYTESUR LARGE (A) 03/20/2016 1932   Sepsis Labs Invalid input(s): PROCALCITONIN,  WBC,  LACTICIDVEN Microbiology Recent Results (from the past 240 hour(s))  Urine C&S     Status: Abnormal   Collection Time: 03/20/16  7:32 PM  Result Value Ref Range Status   Specimen Description URINE, CLEAN CATCH  Final   Special Requests NONE  Final   Culture (A)  Final    <10,000 COLONIES/mL INSIGNIFICANT GROWTH Performed at [facility]    Report Status 03/22/2016 FINAL  Final  Culture, blood (routine x 2)     Status: None (Preliminary result)   Collection Time: 03/21/16  6:19 AM  Result Value Ref Range Status   Specimen Description BLOOD RIGHT ANTECUBITAL  Final   Special Requests BOTTLES DRAWN AEROBIC AND ANAEROBIC 5CC  Final   Culture   Final    NO GROWTH < 24 HOURS Performed at [facility]    Report Status PENDING  Incomplete  Culture, blood (routine x 2)     Status: None (Preliminary result)   Collection Time: 03/21/16  6:19 AM  Result Value Ref Range Status   Specimen Description BLOOD LEFT HAND  Final   Special Requests BOTTLES DRAWN AEROBIC AND ANAEROBIC 5CC  Final   Culture   Final    NO GROWTH < 24 HOURS Performed at [facility]    Report Status PENDING  Incomplete     Time coordinating discharge: Over 30 minutes  SIGNED:   Calvert Cantor, MD  [facility] 03/23/2016, 10:06 AM Pager   If 7PM-7AM, please contact night-coverage [email or web address] Password [email or web address]

## 2016-03-26 LAB — CULTURE, BLOOD (ROUTINE X 2)
CULTURE: NO GROWTH
CULTURE: NO GROWTH

## 2016-03-28 ENCOUNTER — Other Ambulatory Visit: Payer: Self-pay | Admitting: Family Medicine

## 2016-03-28 DIAGNOSIS — R509 Fever, unspecified: Secondary | ICD-10-CM

## 2016-03-28 DIAGNOSIS — R109 Unspecified abdominal pain: Secondary | ICD-10-CM

## 2016-05-21 NOTE — L&D Delivery Note (Signed)
Delivery Note At  a viable female was delivered via  (Presentation:vertex ; LOA ).  APGAR:  pending; weight  .   Placenta status:spont ,Duncan's .  Cord: 3vc with the following complications:none .  Cord pH: n/a  Anesthesia:  epidural Episiotomy:  none Lacerations:   Suture Repair: n/a Est. Blood Loss 250(mL):    Mom to postpartum.  Baby to Couplet care / Skin to Skin.  Wyvonnia Dusky 02/24/2017, 9:41 AM

## 2016-09-14 ENCOUNTER — Inpatient Hospital Stay (HOSPITAL_COMMUNITY)
Admission: AD | Admit: 2016-09-14 | Discharge: 2016-09-14 | Disposition: A | Discharge status: Home / Self Care | Payer: Self-pay | Source: Ambulatory Visit | Attending: Obstetrics and Gynecology | Admitting: Obstetrics and Gynecology

## 2016-09-14 ENCOUNTER — Inpatient Hospital Stay (HOSPITAL_COMMUNITY): Payer: Self-pay

## 2016-09-14 ENCOUNTER — Encounter (HOSPITAL_COMMUNITY): Payer: Self-pay | Admitting: *Deleted

## 2016-09-14 DIAGNOSIS — M545 Low back pain: Secondary | ICD-10-CM | POA: Insufficient documentation

## 2016-09-14 DIAGNOSIS — N12 Tubulo-interstitial nephritis, not specified as acute or chronic: Secondary | ICD-10-CM

## 2016-09-14 DIAGNOSIS — O9989 Other specified diseases and conditions complicating pregnancy, childbirth and the puerperium: Secondary | ICD-10-CM

## 2016-09-14 DIAGNOSIS — R109 Unspecified abdominal pain: Secondary | ICD-10-CM | POA: Insufficient documentation

## 2016-09-14 DIAGNOSIS — O26891 Other specified pregnancy related conditions, first trimester: Secondary | ICD-10-CM | POA: Insufficient documentation

## 2016-09-14 DIAGNOSIS — Z87891 Personal history of nicotine dependence: Secondary | ICD-10-CM | POA: Insufficient documentation

## 2016-09-14 DIAGNOSIS — O26899 Other specified pregnancy related conditions, unspecified trimester: Secondary | ICD-10-CM

## 2016-09-14 DIAGNOSIS — O99891 Other specified diseases and conditions complicating pregnancy: Secondary | ICD-10-CM

## 2016-09-14 DIAGNOSIS — Z3A12 12 weeks gestation of pregnancy: Secondary | ICD-10-CM | POA: Insufficient documentation

## 2016-09-14 DIAGNOSIS — M549 Dorsalgia, unspecified: Secondary | ICD-10-CM

## 2016-09-14 HISTORY — DX: Chronic kidney disease, unspecified: N18.9

## 2016-09-14 HISTORY — DX: Anemia, unspecified: D64.9

## 2016-09-14 LAB — CBC
HCT: 35.7 % — ABNORMAL LOW (ref 36.0–46.0)
Hemoglobin: 12.2 g/dL (ref 12.0–15.0)
MCH: 31.3 pg (ref 26.0–34.0)
MCHC: 34.2 g/dL (ref 30.0–36.0)
MCV: 91.5 fL (ref 78.0–100.0)
PLATELETS: 229 10*3/uL (ref 150–400)
RBC: 3.9 MIL/uL (ref 3.87–5.11)
RDW: 13.5 % (ref 11.5–15.5)
WBC: 10.9 10*3/uL — ABNORMAL HIGH (ref 4.0–10.5)

## 2016-09-14 LAB — URINALYSIS, ROUTINE W REFLEX MICROSCOPIC
Bilirubin Urine: NEGATIVE
GLUCOSE, UA: NEGATIVE mg/dL
Hgb urine dipstick: NEGATIVE
KETONES UR: NEGATIVE mg/dL
LEUKOCYTES UA: NEGATIVE
Nitrite: NEGATIVE
PROTEIN: NEGATIVE mg/dL
Specific Gravity, Urine: 1.001 — ABNORMAL LOW (ref 1.005–1.030)
pH: 8 (ref 5.0–8.0)

## 2016-09-14 LAB — WET PREP, GENITAL
CLUE CELLS WET PREP: NONE SEEN
Sperm: NONE SEEN
Trich, Wet Prep: NONE SEEN
YEAST WET PREP: NONE SEEN

## 2016-09-14 LAB — POCT PREGNANCY, URINE: PREG TEST UR: POSITIVE — AB

## 2016-09-14 LAB — HCG, QUANTITATIVE, PREGNANCY: HCG, BETA CHAIN, QUANT, S: 60074 m[IU]/mL — AB (ref <5)

## 2016-09-14 MED ORDER — CYCLOBENZAPRINE HCL 10 MG PO TABS: 10.0000 mg | ORAL_TABLET | Freq: Three times a day (TID) | ORAL | 0 refills | Status: DC | PRN

## 2016-09-14 NOTE — Discharge Instructions (Signed)
Back Pain in Pregnancy Back pain during pregnancy is common. Back pain may be caused by several factors that are related to changes during your pregnancy. Follow these instructions at home: Managing pain, stiffness, and swelling   If directed, apply ice for sudden (acute) back pain.  Put ice in a plastic bag.  Place a towel between your skin and the bag.  Leave the ice on for 20 minutes, 2-3 times per day.  If directed, apply heat to the affected area before you exercise:  Place a towel between your skin and the heat pack or heating pad.  Leave the heat on for 20-30 minutes.  Remove the heat if your skin turns bright red. This is especially important if you are unable to feel pain, heat, or cold. You may have a greater risk of getting burned. Activity   Exercise as told by your health care provider. Exercising is the best way to prevent or manage back pain.  Listen to your body when lifting. If lifting hurts, ask for help or bend your knees. This uses your leg muscles instead of your back muscles.  Squat down when picking up something from the floor. Do not bend over.  Only use bed rest as told by your health care provider. Bed rest should only be used for the most severe episodes of back pain. Standing, Sitting, and Lying Down   Do not stand in one place for long periods of time.  Use good posture when sitting. Make sure your head rests over your shoulders and is not hanging forward. Use a pillow on your lower back if necessary.  Try sleeping on your side, preferably the left side, with a pillow or two between your legs. If you are sore after a night's rest, your bed may be too soft. A firm mattress may provide more support for your back during pregnancy. General instructions   Do not wear high heels.  Eat a healthy diet. Try to gain weight within your health care provider's recommendations.  Use a maternity girdle, elastic sling, or back brace as told by your health care  provider.  Take over-the-counter and prescription medicines only as told by your health care provider.  Keep all follow-up visits as told by your health care provider. This is important. This includes any visits with any specialists, such as a physical therapist. Contact a health care provider if:  Your back pain interferes with your daily activities.  You have increasing pain in other parts of your body. Get help right away if:  You develop numbness, tingling, weakness, or problems with the use of your arms or legs.  You develop severe back pain that is not controlled with medicine.  You have a sudden change in bowel or bladder control.  You develop shortness of breath, dizziness, or you faint.  You develop nausea, vomiting, or sweating.  You have back pain that is a rhythmic, cramping pain similar to labor pains. Labor pain is usually 1-2 minutes apart, lasts for about 1 minute, and involves a bearing down feeling or pressure in your pelvis.  You have back pain and your water breaks or you have vaginal bleeding.  You have back pain or numbness that travels down your leg.  Your back pain developed after you fell.  You develop pain on one side of your back.  You see blood in your urine.  You develop skin blisters in the area of your back pain. This information is not intended to replace   advice given to you by your health care provider. Make sure you discuss any questions you have with your health care provider. Document Released: 08/15/2005 Document Revised: 10/13/2015 Document Reviewed: 01/19/2015 Elsevier Interactive Patient Education  2017 [employer] Inc[employer]  

## 2016-09-14 NOTE — MAU Provider Note (Signed)
History     CSN: [id number]  Arrival date and time: 09/14/16 0847   Chief Complaint  Patient presents with  . Abdominal Pain  . Back Pain   [id number]  by LMP here with back pain and abd pain. Back pain started yesterday. Describes as intermittent, mid level of back on right side. Rates pain 9/10. She used two Tylenol #3 about 2 hrs ago and had short time of relief. Worsens with urination. She also developed lower abdominal pain today. Describes as dull achy pain, midline of lower abd, and intermittent. Rates pain 6/10. Reports heavy pink spotting yesterday am, enough to use a liner. No vaginal discharge. No recent IC.    OB History    Gravida Para Term Preterm AB Living   0 2   SAB TAB Ectopic Multiple Live Births   0 0 0   2      Past Medical History:  Diagnosis Date  . Abnormal Pap smear   . Anemia   . Chronic kidney disease    kidney infection  . Pilonidal cyst 2010  . Vaginal Pap smear, abnormal     Past Surgical History:  Procedure Laterality Date  . CERVICAL CONIZATION W/BX  2010  . DILATION AND CURETTAGE OF UTERUS    . LEEP  2010  . PILONIDAL CYST EXCISION  2010  . TONSILLECTOMY      Family History  Problem Relation Age of Onset  . Heart disease Father   . Diabetes Maternal Grandmother     Social History  Substance Use Topics  . Smoking status: Former Smoker    Packs/day: 0.25    Quit date: 10/15/2015  . Smokeless tobacco: Never Used  . Alcohol use No    Allergies:  Allergies  Allergen Reactions  . Nsaids Other (See Comments)    Ibuprofen caused excessive nosebleeding.  Avoids all NSAIDS.  Acetaminophen OK    Prescriptions Prior to Admission  Medication Sig Dispense Refill Last Dose  . ferrous sulfate 325 (65 FE) MG tablet Take 1 tablet (325 mg total) by mouth 2 (two) times daily with a meal. 90 tablet 0 09/14/2016 at Unknown time  . methylphenidate (RITALIN) 20 MG tablet Take 60 mg by mouth daily.   0 09/14/2016 at Unknown time  .  SPRINTEC 28 0.25-35 MG-MCG tablet Take 1 tablet by mouth daily.   98 Past Month at Unknown time  . cefpodoxime (VANTIN) 200 MG tablet Take 1 tablet (200 mg total) by mouth 2 (two) times daily. 22 tablet 0 More than a month at Unknown time  . ibuprofen (ADVIL,MOTRIN) 200 MG tablet Take 400 mg by mouth every 6 (six) hours as needed for fever or moderate pain.   Unknown at Unknown time  . oxyCODONE 10 MG TABS Take 0.5-1 tablets (5-10 mg total) by mouth every 6 (six) hours as needed for severe pain. 30 tablet 0   . polyethylene glycol (MIRALAX) packet Take 17 g by mouth 2 (two) times daily as needed for mild constipation. Can take up to 3 x day as needed 14 each 0 More than a month at Unknown time  . traMADol (ULTRAM) 50 MG tablet Take 50 mg by mouth every 6 (six) hours as needed for moderate pain.    More than a month at Unknown time    Review of Systems  Constitutional: Negative for fever.  Gastrointestinal: Positive for abdominal pain and vomiting (once this am). Negative for constipation and diarrhea.  Genitourinary:  Positive for difficulty urinating (low volume), frequency and vaginal bleeding. Negative for dysuria, urgency and vaginal discharge.  Musculoskeletal: Positive for back pain.   Physical Exam   Blood pressure 125/79, pulse (!) 115, temperature 98 F (36.7 C), resp. rate 18, height  (1.651 m), weight 69.9 kg (154 lb), last menstrual period 08/21/2016.  Physical Exam  Constitutional: She is oriented to person, place, and time. She appears well-developed and well-nourished. No distress (appears uncomfortable).  HENT:  Head: Normocephalic and atraumatic.  Neck: Normal range of motion. Neck supple.  Respiratory: Effort normal.  GI: Soft. She exhibits no distension. There is no hepatosplenomegaly. There is tenderness in the periumbilical area. There is CVA tenderness. There is no rebound.  Genitourinary:  Genitourinary Comments: External: no lesions or erythema Vagina: rugated,  parous, thin white discharge Uterus: non enlarged, anteverted, non tender, no CMT Adnexae: no masses, no tenderness left, no tenderness right   Musculoskeletal: Normal range of motion.  Neurological: She is alert and oriented to person, place, and time.  Skin: Skin is warm and dry.  Psychiatric: She has a normal mood and affect.   Results for orders placed or performed during the hospital encounter of 09/14/16 (from the past 24 hour(s))  Urinalysis, Routine w reflex microscopic     Status: Abnormal   Collection Time: 09/14/16  8:55 AM  Result Value Ref Range   Color, Urine STRAW (A) YELLOW   APPearance CLEAR CLEAR   Specific Gravity, Urine 1.001 (L) 1.005 - 1.030   pH 8.0 5.0 - 8.0   Glucose, UA NEGATIVE NEGATIVE mg/dL   Hgb urine dipstick NEGATIVE NEGATIVE   Bilirubin Urine NEGATIVE NEGATIVE   Ketones, ur NEGATIVE NEGATIVE mg/dL   Protein, ur NEGATIVE NEGATIVE mg/dL   Nitrite NEGATIVE NEGATIVE   Leukocytes, UA NEGATIVE NEGATIVE  Pregnancy, urine POC     Status: Abnormal   Collection Time: 09/14/16  9:05 AM  Result Value Ref Range   Preg Test, Ur POSITIVE (A) NEGATIVE  Wet prep, genital     Status: Abnormal   Collection Time: 09/14/16  9:34 AM  Result Value Ref Range   Yeast Wet Prep HPF POC NONE SEEN NONE SEEN   Trich, Wet Prep NONE SEEN NONE SEEN   Clue Cells Wet Prep HPF POC NONE SEEN NONE SEEN   WBC, Wet Prep HPF POC FEW (A) NONE SEEN   Sperm NONE SEEN   CBC     Status: Abnormal   Collection Time: 09/14/16  9:51 AM  Result Value Ref Range   WBC 10.9 (H) 4.0 - 10.5 K/uL   RBC 3.90 3.87 - 5.11 MIL/uL   Hemoglobin 12.2 12.0 - 15.0 g/dL   HCT 16.1 (L) 09.6 - 04.5 %   MCV 91.5 78.0 - 100.0 fL   MCH 31.3 26.0 - 34.0 pg   MCHC 34.2 30.0 - 36.0 g/dL   RDW 40.9 81.1 - 91.4 %   Platelets 229 150 - 400 K/uL  hCG, quantitative, pregnancy     Status: Abnormal   Collection Time: 09/14/16  9:51 AM  Result Value Ref Range   hCG, Beta Chain, Quant, S 60,074 (H) <5 mIU/mL   US  Ob Comp Less 14 Wks  Result Date: 09/14/2016 CLINICAL DATA:  Low back pain. EXAM: OBSTETRIC <14 WK ULTRASOUND TECHNIQUE: Transabdominal ultrasound was performed for evaluation of the gestation as well as the maternal uterus and adnexal regions. COMPARISON:  None. FINDINGS: Intrauterine gestational sac: Single Yolk sac:  Visualized Embryo:  Visualized Cardiac Activity: Visualized Heart Rate: 150 bpm MSD:   mm    w     d CRL:   53.5  mm   12 w 0 d                  [place] EDC: 03/29/2017 Subchorionic hemorrhage:  None visualized. Maternal uterus/adnexae: No adnexal mass.  No free fluid. IMPRESSION: Twelve week intrauterine pregnancy. Fetal heart rate 150 beats per minute. No acute maternal findings. Electronically Signed   By: Charlett Nose M.D.   On: 09/14/2016 10:47   US Renal  Result Date: 09/14/2016 CLINICAL DATA:  Pregnancy, low back pain. EXAM: RENAL / URINARY TRACT ULTRASOUND COMPLETE COMPARISON:  None. FINDINGS: Right Kidney: Length: 11.4 cm. Echogenicity within normal limits. No mass or hydronephrosis visualized. Left Kidney: Length: 12.1 cm. Echogenicity within normal limits. No mass or hydronephrosis visualized. Bladder: Appears normal for degree of bladder distention. Bilateral ureteral jets are noted. IMPRESSION: Normal renal ultrasound. Electronically Signed   By: Lupita Raider, M.D.   On: 09/14/2016 10:47    MAU Course  Procedures Renal US OB US Heating pack  MDM Labs and [place] ordered and reviewed. No evidence of UTI, pyelo, or nephrolithiasis. Renal US neg. Normal 12 wk IUP on [place]. After further discussion, pt admits to yard work yesterday-pushing a Public house manager flowers therefore I feel her pain is MSK. She is stable for discharge home.   Assessment and Plan   1. [redacted] weeks gestation of pregnancy   2. Abdominal pain affecting pregnancy   3. Flank pain   4. Back pain affecting pregnancy   5. Musculoskeletal back pain    Discharge home Follow up with OB provider of choice asap Rx  Flexeril Heating pad/warm bath prn  Allergies as of 09/14/2016      Reactions   Nsaids Other (See Comments)   Ibuprofen caused excessive nosebleeding.  Avoids all NSAIDS.  Acetaminophen OK      Medication List    STOP taking these medications   acetaminophen-codeine 300-30 MG tablet Commonly known as:  TYLENOL #3   cefpodoxime 200 MG tablet Commonly known as:  VANTIN   ferrous sulfate 325 (65 FE) MG tablet   HEATHER 0.35 MG tablet Generic drug:  norethindrone   methylphenidate 20 MG tablet Commonly known as:  RITALIN   Oxycodone HCl 10 MG Tabs   polyethylene glycol packet Commonly known as:  MIRALAX     TAKE these medications   cyclobenzaprine 10 MG tablet Commonly known as:  FLEXERIL Take 1 tablet (10 mg total) by mouth every 8 (eight) hours as needed for muscle spasms.   prenatal multivitamin Tabs tablet Take 1 tablet by mouth daily at 12 noon.      Donette Larry, CNM 09/14/2016, 9:28 AM

## 2016-09-14 NOTE — MAU Note (Signed)
Pt presents to MAU with complaints of pain in her lower abdomen, and pain in her lower back. Denies vaginal bleeding reports small amount of spotting last night., or abnormal discharge

## 2016-09-17 LAB — GC/CHLAMYDIA PROBE AMP (~~LOC~~) NOT AT ARMC
Chlamydia: NEGATIVE
NEISSERIA GONORRHEA: NEGATIVE

## 2016-10-19 ENCOUNTER — Inpatient Hospital Stay (HOSPITAL_COMMUNITY)
Admission: AD | Admit: 2016-10-19 | Discharge: 2016-10-19 | Disposition: A | Discharge status: Home / Self Care | Payer: 59 | Source: Ambulatory Visit | Attending: Emergency Medicine | Admitting: Emergency Medicine

## 2016-10-19 ENCOUNTER — Inpatient Hospital Stay (HOSPITAL_COMMUNITY): Payer: 59

## 2016-10-19 ENCOUNTER — Encounter (HOSPITAL_COMMUNITY): Payer: Self-pay

## 2016-10-19 DIAGNOSIS — O219 Vomiting of pregnancy, unspecified: Secondary | ICD-10-CM

## 2016-10-19 DIAGNOSIS — Z87891 Personal history of nicotine dependence: Secondary | ICD-10-CM | POA: Diagnosis not present

## 2016-10-19 DIAGNOSIS — D72829 Elevated white blood cell count, unspecified: Secondary | ICD-10-CM

## 2016-10-19 DIAGNOSIS — O9989 Other specified diseases and conditions complicating pregnancy, childbirth and the puerperium: Secondary | ICD-10-CM

## 2016-10-19 DIAGNOSIS — Z3A17 17 weeks gestation of pregnancy: Secondary | ICD-10-CM | POA: Diagnosis not present

## 2016-10-19 DIAGNOSIS — B373 Candidiasis of vulva and vagina: Secondary | ICD-10-CM | POA: Insufficient documentation

## 2016-10-19 DIAGNOSIS — R197 Diarrhea, unspecified: Secondary | ICD-10-CM | POA: Insufficient documentation

## 2016-10-19 DIAGNOSIS — R112 Nausea with vomiting, unspecified: Secondary | ICD-10-CM | POA: Diagnosis not present

## 2016-10-19 DIAGNOSIS — O26892 Other specified pregnancy related conditions, second trimester: Secondary | ICD-10-CM | POA: Insufficient documentation

## 2016-10-19 DIAGNOSIS — Z3492 Encounter for supervision of normal pregnancy, unspecified, second trimester: Secondary | ICD-10-CM

## 2016-10-19 DIAGNOSIS — R1031 Right lower quadrant pain: Secondary | ICD-10-CM | POA: Insufficient documentation

## 2016-10-19 DIAGNOSIS — B3731 Acute candidiasis of vulva and vagina: Secondary | ICD-10-CM

## 2016-10-19 DIAGNOSIS — R109 Unspecified abdominal pain: Secondary | ICD-10-CM | POA: Diagnosis not present

## 2016-10-19 LAB — COMPREHENSIVE METABOLIC PANEL
ALBUMIN: 3.7 g/dL (ref 3.5–5.0)
ALT: 16 U/L (ref 14–54)
ANION GAP: 9 (ref 5–15)
AST: 21 U/L (ref 15–41)
Alkaline Phosphatase: 52 U/L (ref 38–126)
BILIRUBIN TOTAL: 0.4 mg/dL (ref 0.3–1.2)
BUN: 6 mg/dL (ref 6–20)
CHLORIDE: 107 mmol/L (ref 101–111)
CO2: 22 mmol/L (ref 22–32)
Calcium: 8.6 mg/dL — ABNORMAL LOW (ref 8.9–10.3)
Creatinine, Ser: 0.42 mg/dL — ABNORMAL LOW (ref 0.44–1.00)
GFR calc Af Amer: >60 mL/min (ref >60)
GFR calc non Af Amer: >60 mL/min (ref >60)
GLUCOSE: 85 mg/dL (ref 65–99)
POTASSIUM: 3.5 mmol/L (ref 3.5–5.1)
SODIUM: 138 mmol/L (ref 135–145)
TOTAL PROTEIN: 7.1 g/dL (ref 6.5–8.1)

## 2016-10-19 LAB — URINALYSIS, ROUTINE W REFLEX MICROSCOPIC
Bilirubin Urine: NEGATIVE
Glucose, UA: NEGATIVE mg/dL
Ketones, ur: NEGATIVE mg/dL
Leukocytes, UA: NEGATIVE
Nitrite: NEGATIVE
Protein, ur: NEGATIVE mg/dL
Specific Gravity, Urine: 1.011 (ref 1.005–1.030)
pH: 6 (ref 5.0–8.0)

## 2016-10-19 LAB — CBC WITH DIFFERENTIAL/PLATELET
Basophils Absolute: 0.1 10*3/uL (ref 0.0–0.1)
Basophils Relative: 0 %
Eosinophils Absolute: 0.1 10*3/uL (ref 0.0–0.7)
Eosinophils Relative: 0 %
HCT: 34.3 % — ABNORMAL LOW (ref 36.0–46.0)
Hemoglobin: 11.7 g/dL — ABNORMAL LOW (ref 12.0–15.0)
Lymphocytes Relative: 21 %
Lymphs Abs: 3.4 10*3/uL (ref 0.7–4.0)
MCH: 31.6 pg (ref 26.0–34.0)
MCHC: 34.1 g/dL (ref 30.0–36.0)
MCV: 92.7 fL (ref 78.0–100.0)
Monocytes Absolute: 0.7 10*3/uL (ref 0.1–1.0)
Monocytes Relative: 5 %
Neutro Abs: 11.6 10*3/uL — ABNORMAL HIGH (ref 1.7–7.7)
Neutrophils Relative %: 74 %
Platelets: 223 10*3/uL (ref 150–400)
RBC: 3.7 MIL/uL — ABNORMAL LOW (ref 3.87–5.11)
RDW: 13.5 % (ref 11.5–15.5)
WBC: 15.9 10*3/uL — ABNORMAL HIGH (ref 4.0–10.5)

## 2016-10-19 LAB — LIPASE, BLOOD: Lipase: 24 U/L (ref 11–51)

## 2016-10-19 LAB — WET PREP, GENITAL
CLUE CELLS WET PREP: NONE SEEN
Sperm: NONE SEEN
Trich, Wet Prep: NONE SEEN

## 2016-10-19 MED ORDER — CLOTRIMAZOLE 1 % VA CREA: 1.0000 | TOPICAL_CREAM | Freq: Every day | VAGINAL | 0 refills | Status: AC

## 2016-10-19 MED ORDER — MORPHINE SULFATE (PF) 2 MG/ML IV SOLN
4.0000 mg | Freq: Once | INTRAVENOUS | Status: AC
  Administered 2016-10-19: 4 mg via INTRAVENOUS
  Filled 2016-10-19: qty 2

## 2016-10-19 MED ORDER — ONDANSETRON 4 MG PO TBDP: 4.0000 mg | ORAL_TABLET | Freq: Three times a day (TID) | ORAL | 0 refills | Status: DC | PRN

## 2016-10-19 MED ORDER — SODIUM CHLORIDE 0.9 % IV BOLUS (SEPSIS)
1000.0000 mL | Freq: Once | INTRAVENOUS | Status: AC
  Administered 2016-10-19: 1000 mL via INTRAVENOUS

## 2016-10-19 MED ORDER — ONDANSETRON HCL 4 MG/2ML IJ SOLN
4.0000 mg | Freq: Once | INTRAMUSCULAR | Status: AC
  Administered 2016-10-19: 4 mg via INTRAVENOUS
  Filled 2016-10-19: qty 2

## 2016-10-19 NOTE — ED Provider Notes (Signed)
[facility]-EMERGENCY DEPT Provider Note   CSN: [id number] Arrival date & time: 10/19/16  1144     History   Chief Complaint Chief Complaint  Patient presents with  . Abdominal Pain    HPI Gloria Weber is a 31 y.o. [phone] female currently pregnant at [redacted]w[redacted]d by LMP, with a PMHx of remote pyelonephritis, chronic anemia, and pilonidal cyst, who presents to the ED transferred from [facility] hospital for evaluation of her RLQ abd pain and n/v x2-3 days. Per pt, she states that she was supposed to have a visit at [facility] in order to establish prenatal care today, but due to concerns of ongoing RLQ pain, they sent her to [facility] hospital for evaluation of this issue. Per chart review, [facility] a [facility] exam which demonstrated white vaginal discharge but otherwise was unremarkable; she had CBC w/diff done which demonstrated leukocytosis 15.9 with neutrophilic predominance; U/A there was not convincing for UTI, OB urine culture sent; wet prep was done and demonstrated yeast and many WBCs, but no trich or clue cells; GC/CT testing was obtained during pelvic exam there today. The providers at [facility] hospital spoke with radiology who recommended MRI abd/pelv in order to evaluate for possible appendicitis, so she was transferred here. She states that the pain is 7/10 intermittent dull achy and pressure-like RLQ nonradiating pain that worsens when she eats and has been unrelieved with Tums, Tylenol, and Rolaids. She endorses having 2 episodes daily of nonbloody watery diarrhea for the first 2 days of her pain, but this subsided yesterday. She also reports nausea and 1-2 episodes daily of nonbloody nonbilious emesis. She reports that she just found out that she was pregnant, has not actually established prenatal care, and wishes to establish care with [facility] now. LMP 08/21/16. No prior miscarriages or elective abortions.  She denies fevers, chills, CP, SOB,  constipation, obstipation, melena, hematochezia, hematemesis, hematuria, dysuria, malodorous urine, vaginal bleeding/discharge, myalgias, arthralgias, numbness, tingling, focal weakness, or any other complaints at this time. Denies recent travel, sick contacts, suspicious food intake, EtOH use, NSAID use, or prior abd surgeries.    The history is provided by the patient and medical records. No language interpreter was used.  Abdominal Pain   This is a new problem. The current episode started more than 2 days ago. The problem occurs daily. The problem has not changed since onset.The pain is associated with an unknown factor. The pain is located in the RLQ. The quality of the pain is dull, aching and pressure-like. The pain is at a severity of 7/10. The pain is moderate. Associated symptoms include diarrhea, nausea and vomiting. Pertinent negatives include fever, flatus, hematochezia, melena, constipation, dysuria, hematuria, arthralgias and myalgias. The symptoms are aggravated by eating. Nothing relieves the symptoms.    Past Medical History:  Diagnosis Date  . Abnormal Pap smear   . Anemia   . Chronic kidney disease    kidney infection  . Pilonidal cyst 2010  . Vaginal Pap smear, abnormal     Patient Active Problem List   Diagnosis Date Noted  . Anemia, iron deficiency 03/23/2016  . Pyelonephritis 03/21/2016  . Sepsis (HCC) 03/21/2016  . Hypokalemia 03/21/2016    Past Surgical History:  Procedure Laterality Date  . CERVICAL CONIZATION W/BX  2010  . DILATION AND CURETTAGE OF UTERUS    . LEEP  2010  . PILONIDAL CYST EXCISION  2010  . TONSILLECTOMY      OB History  Gravida Para Term Preterm AB Living   4 2 1 1  0 2   SAB TAB Ectopic Multiple Live Births   0 0 0   2       Home Medications    Prior to Admission medications   Medication Sig Start Date End Date Taking? Authorizing Provider  acetaminophen-codeine (TYLENOL #3) 300-30 MG tablet Take 1 tablet by mouth every 8  (eight) hours as needed for moderate pain.   Yes [provider]  albuterol (PROAIR HFA) 108 (90 Base) MCG/ACT inhaler Inhale 1-2 puffs into the lungs every 6 (six) hours as needed for wheezing or shortness of breath.   Yes [provider]  cyclobenzaprine (FLEXERIL) 10 MG tablet Take 1 tablet (10 mg total) by mouth every 8 (eight) hours as needed for muscle spasms. 09/14/16  Yes Donette Larry, CNM  methylphenidate (RITALIN) 20 MG tablet Take 60 mg by mouth daily. 09/13/16  Yes [provider]  Prenatal Vit-Fe Fumarate-FA (PRENATAL MULTIVITAMIN) TABS tablet Take 1 tablet by mouth daily at 12 noon.   Yes [provider]    Family History Family History  Problem Relation Age of Onset  . Heart disease Father   . Diabetes Maternal Grandmother     Social History Social History  Substance Use Topics  . Smoking status: Former Smoker    Packs/day: 0.25    Quit date: 10/15/2015  . Smokeless tobacco: Never Used  . Alcohol use No     Allergies   Nsaids   Review of Systems Review of Systems  Constitutional: Negative for chills and fever.  Respiratory: Negative for shortness of breath.   Cardiovascular: Negative for chest pain.  Gastrointestinal: Positive for abdominal pain, diarrhea, nausea and vomiting. Negative for blood in stool, constipation, flatus, hematochezia and melena.  Genitourinary: Negative for dysuria, hematuria, vaginal bleeding and vaginal discharge.       No malodorous urine  Musculoskeletal: Negative for arthralgias and myalgias.  Skin: Negative for color change.  Allergic/Immunologic: Negative for immunocompromised state.  Neurological: Negative for weakness and numbness.  Psychiatric/Behavioral: Negative for confusion.   All other systems reviewed and are negative for acute change except as noted in the HPI.    Physical Exam Updated Vital Signs BP 105/65 (BP Location: Left Arm)   Pulse 71   Temp 98.1 F (36.7 C) (Oral)    Resp 18   Ht 5\' 5"  (1.651 m)   Wt 72.2 kg (159 lb 4 oz)   LMP 08/21/2016 (Exact Date) Comment: 2 day cycle  SpO2 100%   BMI 26.50 kg/m   Physical Exam  Constitutional: She is oriented to person, place, and time. Vital signs are normal. She appears well-developed and well-nourished.  Non-toxic appearance. No distress.  Afebrile, nontoxic, NAD  HENT:  Head: Normocephalic and atraumatic.  Mouth/Throat: Oropharynx is clear and moist and mucous membranes are normal.  Eyes: Conjunctivae and EOM are normal. Right eye exhibits no discharge. Left eye exhibits no discharge.  Neck: Normal range of motion. Neck supple.  Cardiovascular: Normal rate, regular rhythm, normal heart sounds and intact distal pulses.  Exam reveals no gallop and no friction rub.   No murmur heard. Pulmonary/Chest: Effort normal and breath sounds normal. No respiratory distress. She has no decreased breath sounds. She has no wheezes. She has no rhonchi. She has no rales.  Abdominal: Soft. Normal appearance and bowel sounds are normal. She exhibits no distension. There is tenderness in the right lower quadrant. There is rebound, guarding and  tenderness at McBurney's point. There is no rigidity, no CVA tenderness and negative Murphy's sign.  Soft, nondisteded but gravid up to suprapubic region near level of umbilicus, +BS throughout, with moderate RLQ TTP at mcburney's point, some voluntary guarding, +rebound pain, no rigidity, neg murphy's, +mcburney's, no CVA TTP   Genitourinary:  Genitourinary Comments: Deferred due [facility]  Musculoskeletal: Normal range of motion.  Neurological: She is alert and oriented to person, place, and time. She has normal strength. No sensory deficit.  Skin: Skin is warm, dry and intact. No rash noted.  Psychiatric: She has a normal mood and affect.  Nursing note and vitals reviewed.    ED Treatments / Results  Labs (all labs ordered are listed, but only  abnormal results are displayed) Labs Reviewed  WET PREP, GENITAL - Abnormal; Notable for the following:       Result Value   Yeast Wet Prep HPF POC PRESENT (*)    WBC, Wet Prep HPF POC MANY (*)    All other components within normal limits  URINALYSIS, ROUTINE W REFLEX MICROSCOPIC - Abnormal; Notable for the following:    Hgb urine dipstick SMALL (*)    Bacteria, UA RARE (*)    Squamous Epithelial / LPF 0-5 (*)    All other components within normal limits  CBC WITH DIFFERENTIAL/PLATELET - Abnormal; Notable for the following:    WBC 15.9 (*)    RBC 3.70 (*)    Hemoglobin 11.7 (*)    HCT 34.3 (*)    Neutro Abs 11.6 (*)    All other components within normal limits  COMPREHENSIVE METABOLIC PANEL - Abnormal; Notable for the following:    Creatinine, Ser 0.42 (*)    Calcium 8.6 (*)    All other components within normal limits  CULTURE, OB URINE  LIPASE, BLOOD  GC/CHLAMYDIA PROBE AMP ([place]) [facility]    EKG  EKG Interpretation None       Radiology Mr Pelvis Wo Contrast  Result Date: 10/19/2016 CLINICAL DATA:  Two-day history of intermittent right lower quadrant dull achy pain in a patient with known intrauterine gestation. EXAM: MRI ABDOMEN AND PELVIS WITHOUT CONTRAST TECHNIQUE: Multiplanar multisequence MR imaging of the abdomen and pelvis was performed. No intravenous contrast was administered. COMPARISON:  None. FINDINGS: COMBINED FINDINGS FOR BOTH MR ABDOMEN AND PELVIS Lower chest: Unremarkable. Hepatobiliary: No focal abnormality within the visualized liver parenchyma. There is no evidence for gallstones, gallbladder wall thickening, or pericholecystic fluid. No intrahepatic or extrahepatic biliary dilation. Pancreas: No focal mass lesion. No dilatation of the main duct. No intraparenchymal cyst. No peripancreatic edema. Spleen:  No splenomegaly. No focal mass lesion. Adrenals/Urinary Tract: No adrenal nodule or mass. Kidneys unremarkable. Specifically, no  hydroureteronephrosis. The urinary bladder appears normal for the degree of distention. Stomach/Bowel: Stomach is nondistended. No gastric wall thickening. No evidence of outlet obstruction. Duodenum is normally positioned as is the ligament of Treitz. No evidence for small bowel dilatation. Terminal ileum normal. Cecal tip is low in the pelvis, contiguous with the right ovary. The appendix is not discretely identified, but there is no edema or inflammation in the region of the cecum. Colon unremarkable. Vascular/Lymphatic: No abdominal aortic aneurysm. No abdominal lymphadenopathy No pelvic sidewall lymphadenopathy. Reproductive: Intrauterine gestation identified. Ovaries are normal in appearance bilaterally. Other:  No appreciable intraperitoneal free fluid. Musculoskeletal: No abnormal marrow signal within the visualized bony anatomy. IMPRESSION: No acute findings in the abdomen or pelvis. Specifically,  no findings to explain the patient's history of right lower quadrant pain. Although the appendix is not discretely identified on this study there is no edema or inflammation in the region of the cecum. No right hydroureteronephrosis. Terminal ileum is unremarkable. Electronically Signed   By: Kennith Center M.D.   On: 10/19/2016 17:38   Mr Abdomen Wo Contrast  Result Date: 10/19/2016 CLINICAL DATA:  Two-day history of intermittent right lower quadrant dull achy pain in a patient with known intrauterine gestation. EXAM: MRI ABDOMEN AND PELVIS WITHOUT CONTRAST TECHNIQUE: Multiplanar multisequence MR imaging of the abdomen and pelvis was performed. No intravenous contrast was administered. COMPARISON:  None. FINDINGS: COMBINED FINDINGS FOR BOTH MR ABDOMEN AND PELVIS Lower chest: Unremarkable. Hepatobiliary: No focal abnormality within the visualized liver parenchyma. There is no evidence for gallstones, gallbladder wall thickening, or pericholecystic fluid. No intrahepatic or extrahepatic biliary dilation.  Pancreas: No focal mass lesion. No dilatation of the main duct. No intraparenchymal cyst. No peripancreatic edema. Spleen:  No splenomegaly. No focal mass lesion. Adrenals/Urinary Tract: No adrenal nodule or mass. Kidneys unremarkable. Specifically, no hydroureteronephrosis. The urinary bladder appears normal for the degree of distention. Stomach/Bowel: Stomach is nondistended. No gastric wall thickening. No evidence of outlet obstruction. Duodenum is normally positioned as is the ligament of Treitz. No evidence for small bowel dilatation. Terminal ileum normal. Cecal tip is low in the pelvis, contiguous with the right ovary. The appendix is not discretely identified, but there is no edema or inflammation in the region of the cecum. Colon unremarkable. Vascular/Lymphatic: No abdominal aortic aneurysm. No abdominal lymphadenopathy No pelvic sidewall lymphadenopathy. Reproductive: Intrauterine gestation identified. Ovaries are normal in appearance bilaterally. Other:  No appreciable intraperitoneal free fluid. Musculoskeletal: No abnormal marrow signal within the visualized bony anatomy. IMPRESSION: No acute findings in the abdomen or pelvis. Specifically, no findings to explain the patient's history of right lower quadrant pain. Although the appendix is not discretely identified on this study there is no edema or inflammation in the region of the cecum. No right hydroureteronephrosis. Terminal ileum is unremarkable. Electronically Signed   By: Kennith Center M.D.   On: 10/19/2016 17:38    Procedures Procedures (including critical care time)  Medications Ordered in ED Medications  ondansetron (ZOFRAN) injection 4 mg (4 mg Intravenous Given 10/19/16 1726)  morphine 2 MG/ML injection 4 mg (4 mg Intravenous Given 10/19/16 1726)  sodium chloride 0.9 % bolus 1,000 mL (0 mLs Intravenous Stopped 10/19/16 1829)     Initial Impression / Assessment and Plan / ED Course  I have reviewed the triage vital signs and the  nursing notes.  Pertinent labs & imaging results that were available during my care of the patient were reviewed by me and considered in my medical decision making (see chart for details).     31 y.o. female currently pregnant at [redacted]w[redacted]d by LMP, [facility] ?[facility]; sent for MRI of abd/pelv. Complained of RLQ pain/n/v x2-3 days, went there for eval because her OBGYN's office called her and advised her to go due to the pain; and they performed pelvic which revealed white discharge but otherwise unremarkable, did CBC w/diff which demonstrated leukocytosis 15.9 with neutrophilic predominance; wet prep and OB culture obtained, wet prep showing +yeast and many WBCs. GC/CT testing was sent off there as well. U/A there without convincing evidence of UTI. On exam here, +RLQ TTP at mcburney's point, +psoas, +foot tap test, no flank tenderness, +voluntary guarding, some rebound pain, no rigidity.  Afebrile and nontoxic. I agree with concern for appendicitis. MRI's already ordered by [facility] hospital provider. Will add-on CMP and lipase, and await MRI abd/pelv to eval for appendicitis. Pain and nausea meds given, r/b/a advised but pt chose to proceed with zofran/morphine options. Will give fluids and reassess shortly  8:22 PM CMP WNL. Lipase WNL. MRI does not actually visualize the appendix, but no evidence of infection/inflammation around the area. No focal findings. Pain improved, nausea improved, tolerating PO well. Likely either round ligament pain vs viral gastroenteritis. Will treat yeast infection since she's pregnant, doubt UTI and UCx pending. Will send home with zofran, advised BRAT diet, tylenol for pain, good hydration, and f/up with OBGYN in 3-5 days for recheck. I explained the diagnosis and have given explicit precautions to return to the ER including for any other new or worsening symptoms. The patient understands and accepts the medical plan as it's been dictated and I have  answered their questions. Discharge instructions concerning home care and prescriptions have been given. The patient is STABLE and is discharged to home in good condition.    Final Clinical Impressions(s) / ED Diagnoses   Final diagnoses:  Leukocytosis, unspecified type  Colicky RLQ abdominal pain  Nausea and vomiting during pregnancy  Diarrhea, unspecified type  Yeast vaginitis    New Prescriptions New Prescriptions   CLOTRIMAZOLE (GYNE-LOTRIMIN) 1 % VAGINAL CREAM    Place 1 Applicatorful vaginally at bedtime.   ONDANSETRON (ZOFRAN ODT) 4 MG DISINTEGRATING TABLET    Take 1 tablet (4 mg total) by mouth every 8 (eight) hours as needed for nausea or vomiting.     [address], [place], [place] 10/19/16 2024    Samuel Jester, DO 10/19/16 [phone]

## 2016-10-19 NOTE — MAU Note (Signed)
Having pain in RLQ, comes and goes, started 2 days ago.   Dull achy pain. Was spotting a couple of days ago. Has had bleeding other times during preg.  [facility] Received call prior to scheduled appt saying she had to have $1800 today.  Little pain with urination.

## 2016-10-19 NOTE — MAU Provider Note (Signed)
[facility] Attending MAU Note  Chief Complaint: Abdominal Pain    First Provider Initiated Contact with Patient 10/19/16 1335      SUBJECTIVE Gloria Weber is a 31 y.o. [id number] at [redacted]w[redacted]d by LMP who presents with moderate RLQ for a few days.  Associated with nausea and vomiting.  No fevers. Nothing alleviates the pain, worsens with movement.  Has anorexia due to the pain. Pain is episodic, but getting worse.   Denies any abnormal vaginal discharge, fevers, chills, sweats, dysuria, other GI or GU symptoms or other general symptoms.   Past Medical History:  Diagnosis Date  . Abnormal Pap smear   . Anemia   . Chronic kidney disease    kidney infection  . Pilonidal cyst 2010  . Vaginal Pap smear, abnormal    OB History  Gravida Para Term Preterm AB Living  4 2 1 1  0 2  SAB TAB Ectopic Multiple Live Births  0 0 0   2    # Outcome Date GA Lbr Len/2nd Weight Sex Delivery Anes PTL Lv  4 Current           3 Preterm 01/22/11 [redacted]w[redacted]d 14:50 / 00:25 2 lb 12.4 oz (1.26 kg) M Vag-Spont EPI  LIV  2 Term 04/09/02 [redacted]w[redacted]d   M Vag-Spont EPI N LIV     Birth Comments: PIH  1 Gravida              Past Surgical History:  Procedure Laterality Date  . CERVICAL CONIZATION W/BX  2010  . DILATION AND CURETTAGE OF UTERUS    . LEEP  2010  . PILONIDAL CYST EXCISION  2010  . TONSILLECTOMY     Social History   Social History  . Marital status: Widowed    Spouse name: N/A  . Number of children: N/A  . Years of education: N/A   Occupational History  . Not on file.   Social History Main Topics  . Smoking status: Former Smoker    Packs/day: 0.25    Quit date: 10/15/2015  . Smokeless tobacco: Never Used  . Alcohol use No  . Drug use: No  . Sexual activity: Yes   Other Topics Concern  . Not on file   Social History Narrative  . No narrative on file   No current facility-administered medications on file prior to encounter.    Current Outpatient Prescriptions on File Prior to  Encounter  Medication Sig Dispense Refill  . cyclobenzaprine (FLEXERIL) 10 MG tablet Take 1 tablet (10 mg total) by mouth every 8 (eight) hours as needed for muscle spasms. 30 tablet 0  . Prenatal Vit-Fe Fumarate-FA (PRENATAL MULTIVITAMIN) TABS tablet Take 1 tablet by mouth daily at 12 noon.     Allergies  Allergen Reactions  . Nsaids Other (See Comments)    Ibuprofen caused excessive nosebleeding.  Avoids all NSAIDS.  Acetaminophen OK    ROS: Pertinent items in HPI  OBJECTIVE BP 105/65 (BP Location: Left Arm)   Pulse 71   Temp 98.1 F (36.7 C) (Oral)   Resp 18   Ht 5\' 5"  (1.651 m)   Wt 159 lb 4 oz (72.2 kg)   LMP 08/21/2016 (Exact Date) Comment: 2 day cycle  SpO2 100%   BMI 26.50 kg/m   FHR reassuring CONSTITUTIONAL: Well-developed, well-nourished female in no acute distress.  HENT:  Normocephalic, atraumatic, External right and left ear normal. Oropharynx is clear and moist EYES: Conjunctivae and EOM are normal. Pupils are equal,  round, and reactive to light. No scleral icterus.  NECK: Normal range of motion, supple, no masses.  Normal thyroid.  SKIN: Skin is warm and dry. No rash noted. Not diaphoretic. No erythema. No pallor. NEUROLGIC: Alert and oriented to person, place, and time. Normal reflexes, muscle tone coordination. No cranial nerve deficit noted. PSYCHIATRIC: Normal mood and affect. Normal behavior. Normal judgment and thought content. CARDIOVASCULAR: Normal heart rate noted RESPIRATORY: Effort and breath sounds normal, no problems with respiration noted. ABDOMEN: Soft, normal bowel sounds, no distention noted.  Moderate RLQ tenderness to palpation, no rebound or guarding.  PELVIC: Normal appearing external genitalia; normal appearing vaginal mucosa and cervix.  White discharge, seen, wet prep and pelvic cultures obtained. Gravid uterus, no other palpable masses, no uterine or adnexal tenderness. MUSCULOSKELETAL: Normal range of motion. No tenderness.  No cyanosis,  clubbing, or edema.  2+ distal pulses.  LAB RESULTS Results for orders placed or performed during the hospital encounter of 10/19/16 (from the past 48 hour(s))  Urinalysis, Routine w reflex microscopic     Status: Abnormal   Collection Time: 10/19/16 12:24 PM  Result Value Ref Range   Color, Urine YELLOW YELLOW   APPearance CLEAR CLEAR   Specific Gravity, Urine 1.011 1.005 - 1.030   pH 6.0 5.0 - 8.0   Glucose, UA NEGATIVE NEGATIVE mg/dL   Hgb urine dipstick SMALL (A) NEGATIVE   Bilirubin Urine NEGATIVE NEGATIVE   Ketones, ur NEGATIVE NEGATIVE mg/dL   Protein, ur NEGATIVE NEGATIVE mg/dL   Nitrite NEGATIVE NEGATIVE   Leukocytes, UA NEGATIVE NEGATIVE   RBC / HPF 0-5 0 - 5 RBC/hpf   WBC, UA 0-5 0 - 5 WBC/hpf   Bacteria, UA RARE (A) NONE SEEN   Squamous Epithelial / LPF 0-5 (A) NONE SEEN   Mucous PRESENT   CBC with Differential/Platelet     Status: Abnormal   Collection Time: 10/19/16  1:37 PM  Result Value Ref Range   WBC 15.9 (H) 4.0 - 10.5 K/uL   RBC 3.70 (L) 3.87 - 5.11 MIL/uL   Hemoglobin 11.7 (L) 12.0 - 15.0 g/dL   HCT 91.434.3 (L) 78.236.0 - 95.646.0 %   MCV 92.7 78.0 - 100.0 fL   MCH 31.6 26.0 - 34.0 pg   MCHC 34.1 30.0 - 36.0 g/dL   RDW 21.313.5 08.611.5 - 57.815.5 %   Platelets 223 150 - 400 K/uL   Neutrophils Relative % 74 %   Neutro Abs 11.6 (H) 1.7 - 7.7 K/uL   Lymphocytes Relative 21 %   Lymphs Abs 3.4 0.7 - 4.0 K/uL   Monocytes Relative 5 %   Monocytes Absolute 0.7 0.1 - 1.0 K/uL   Eosinophils Relative 0 %   Eosinophils Absolute 0.1 0.0 - 0.7 K/uL   Basophils Relative 0 %   Basophils Absolute 0.1 0.0 - 0.1 K/uL   [facility], MRI abdomen and pelvis recommended given concern about appendicitis. Wet prep pending at time of transfer.  ASSESSMENT 1. Leukocytosis, unspecified type   2. Second trimester pregnancy   3. Pain, abdominal, RLQ     PLAN Talked to Dr. Geoffery Lyonsouglas Delo, who accepts transfer of care. She is cleared from an OB standpoint and can be  transferred back if there are further obstetric issues. Will await imaging results for further management. Patient is yet to start prenatal care, message sent to clinic to schedule her for initial prenatal visit.   Tereso NewcomerAnyanwu, Yonis Carreon A, MD 10/19/2016 3:12 PM

## 2016-10-19 NOTE — Discharge Instructions (Signed)
Your abdominal pain could be related to early pregnancy pain. Use tylenol as needed for pain. Use zofran as prescribed, as needed for nausea. Your vaginal swab shows yeast infection, so use the clotrimazole cream as directed. Stay well hydrated with small sips of fluids throughout the day. Follow a BRAT (banana-rice-applesauce-toast) diet as described below for the next 24-48 hours. The 'BRAT' diet is suggested, then progress to diet as tolerated as symptoms abate. Call your regular doctor if bloody stools, persistent diarrhea, vomiting, fever or abdominal pain. Follow up with your OBGYN in 3-5 days for recheck of symptoms. [facility] for changing or worsening of symptoms.

## 2016-10-19 NOTE — ED Notes (Signed)
Pt back from MRI 

## 2016-10-19 NOTE — ED Notes (Signed)
Bed: [id number] Expected date:  Expected time:  Means of arrival:  Comments: MAU

## 2016-10-19 NOTE — ED Notes (Signed)
Pt is alert and oriented x 4 and is verbally responsive, Pt significant other is at bedside, Pt offered fluids and food.

## 2016-10-20 LAB — CULTURE, OB URINE: Culture: <10000 — AB

## 2016-10-22 LAB — GC/CHLAMYDIA PROBE AMP (~~LOC~~) NOT AT ARMC
Chlamydia: NEGATIVE
NEISSERIA GONORRHEA: NEGATIVE

## 2016-10-25 ENCOUNTER — Encounter: Payer: Self-pay | Admitting: Family Medicine

## 2016-10-25 ENCOUNTER — Telehealth: Payer: Self-pay | Admitting: Lab

## 2016-10-25 ENCOUNTER — Ambulatory Visit (INDEPENDENT_AMBULATORY_CARE_PROVIDER_SITE_OTHER): Payer: 59 | Admitting: Family Medicine

## 2016-10-25 VITALS — BP 113/71 | HR 83 | Wt 162.6 lb

## 2016-10-25 DIAGNOSIS — Z124 Encounter for screening for malignant neoplasm of cervix: Secondary | ICD-10-CM

## 2016-10-25 DIAGNOSIS — Z1151 Encounter for screening for human papillomavirus (HPV): Secondary | ICD-10-CM | POA: Diagnosis not present

## 2016-10-25 DIAGNOSIS — O0992 Supervision of high risk pregnancy, unspecified, second trimester: Secondary | ICD-10-CM

## 2016-10-25 DIAGNOSIS — O09899 Supervision of other high risk pregnancies, unspecified trimester: Secondary | ICD-10-CM

## 2016-10-25 DIAGNOSIS — Z113 Encounter for screening for infections with a predominantly sexual mode of transmission: Secondary | ICD-10-CM | POA: Diagnosis not present

## 2016-10-25 DIAGNOSIS — Z9889 Other specified postprocedural states: Secondary | ICD-10-CM

## 2016-10-25 DIAGNOSIS — Z8774 Personal history of (corrected) congenital malformations of heart and circulatory system: Secondary | ICD-10-CM

## 2016-10-25 DIAGNOSIS — O3440 Maternal care for other abnormalities of cervix, unspecified trimester: Secondary | ICD-10-CM

## 2016-10-25 DIAGNOSIS — O0990 Supervision of high risk pregnancy, unspecified, unspecified trimester: Secondary | ICD-10-CM

## 2016-10-25 DIAGNOSIS — O09219 Supervision of pregnancy with history of pre-term labor, unspecified trimester: Principal | ICD-10-CM

## 2016-10-25 DIAGNOSIS — O09212 Supervision of pregnancy with history of pre-term labor, second trimester: Secondary | ICD-10-CM

## 2016-10-25 DIAGNOSIS — O3442 Maternal care for other abnormalities of cervix, second trimester: Secondary | ICD-10-CM

## 2016-10-25 LAB — POCT URINALYSIS DIP (DEVICE)
BILIRUBIN URINE: NEGATIVE
GLUCOSE, UA: NEGATIVE mg/dL
Ketones, ur: NEGATIVE mg/dL
Nitrite: NEGATIVE
Protein, ur: NEGATIVE mg/dL
SPECIFIC GRAVITY, URINE: 1.01 (ref 1.005–1.030)
Urobilinogen, UA: 0.2 mg/dL (ref 0.0–1.0)
pH: 7 (ref 5.0–8.0)

## 2016-10-25 NOTE — Addendum Note (Signed)
Addended by: Marrian SalvagePICKARD, JILL on: 10/25/2016 04:49 PM   Modules accepted: Orders

## 2016-10-25 NOTE — Progress Notes (Signed)
Subjective:  Gloria Weber is a [id number] 727w6d being seen today for her first obstetrical visit.  Her obstetrical history is significant for LEEP, PPROM and PTL. Patient does intend to breast feed. Pregnancy history fully reviewed.  Patient reports right sided abdominal pain. [facility]. MRI did not definitively see appendix, but no inflammatory changes.  BP 113/71   Pulse 83   Wt 162 lb 9.6 oz (73.8 kg)   LMP 08/21/2016 (Exact Date) Comment: 2 day cycle  BMI 27.06 kg/m   HISTORY: OB History  Gravida Para Term Preterm AB Living  3 2 1 1  0 2  SAB TAB Ectopic Multiple Live Births  0 0 0   2    # Outcome Date GA Lbr Len/2nd Weight Sex Delivery Anes PTL Lv  3 Current           2 Preterm 01/22/11 3627w4d 14:50 / 00:25 2 lb 12.4 oz (1.26 kg) M Vag-Spont EPI  LIV  1 Term 04/09/02 3325w0d  7 lb 10 oz (3.459 kg) M Vag-Spont EPI N LIV     Birth Comments: PIH      Past Medical History:  Diagnosis Date  . Abnormal Pap smear   . Anemia   . Chronic kidney disease    kidney infection  . Pilonidal cyst 2010  . Vaginal Pap smear, abnormal     Past Surgical History:  Procedure Laterality Date  . CERVICAL CONIZATION W/BX  2010  . DILATION AND CURETTAGE OF UTERUS    . LEEP  2010  . PILONIDAL CYST EXCISION  2010  . TONSILLECTOMY      Family History  Problem Relation Age of Onset  . Heart disease Father   . Diabetes Maternal Grandmother      Exam    Uterus:     Pelvic Exam:    Perineum: No Hemorrhoids, Hemorrhoids, Normal Perineum   Vulva: normal, Bartholin's, Urethra, Skene's normal   Vagina:  normal mucosa   Cervix: multiparous appearance, no bleeding following Pap and no cervical motion tenderness   Adnexa: normal adnexa and no mass, fullness, tenderness   Bony Pelvis: gynecoid  System: Breast:  declined   Skin: normal coloration and turgor, no rashes    Neurologic: gait normal; reflexes normal and symmetric   Extremities: normal strength,  tone, and muscle mass   HEENT PERRLA and extra ocular movement intact   Mouth/Teeth mucous membranes moist, pharynx normal without lesions   Neck supple and no masses   Cardiovascular: regular rate and rhythm, no murmurs or gallops   Respiratory:  appears well, vitals normal, no respiratory distress, acyanotic, normal RR, ear and throat exam is normal, neck free of mass or lymphadenopathy, chest clear, no wheezing, crepitations, rhonchi, normal symmetric air entry   Abdomen: soft, non-tender; bowel sounds normal; no masses,  no organomegaly   Urinary: urethral meatus normal      Assessment:    Pregnancy: [id number] Patient Active Problem List   Diagnosis Date Noted  . Supervision of high risk pregnancy, antepartum 10/25/2016  . History of LEEP (loop electrosurgical excision procedure) of cervix complicating pregnancy, unspecified trimester 10/25/2016  . History of preterm delivery, currently pregnant, unspecified trimester 10/25/2016  . History of congenital heart defect 10/25/2016  . Anemia, iron deficiency 03/23/2016  . Pyelonephritis 03/21/2016  . Sepsis (HCC) 03/21/2016  . Hypokalemia 03/21/2016      Plan:   1. Supervision of high risk pregnancy, antepartum FHT and FH normal.  Pain located on round ligament.  - Culture, OB Urine - Obstetric Panel, Including HIV - [place] MFM OB COMP + 14 WK; Future  2. History of LEEP (loop electrosurgical excision procedure) of cervix complicating pregnancy, unspecified trimester Will need cervical lengths. PAP done today. - Culture, OB Urine - Obstetric Panel, Including HIV - [place] MFM OB COMP + 14 WK; Future  3. History of congenital heart defect Will need Echo - US MFM OB COMP + 14 WK; Future  4. History of preterm delivery, currently pregnant, unspecified trimester Qualifies for Makena. Will fill out application. - [place] MFM OB COMP + 14 WK; Future     Problem list reviewed and updated. 100% of 45 min visit spent on counseling and  coordination of care.    Levie Heritage 08/31/2016

## 2016-10-25 NOTE — Patient Instructions (Signed)
Hydroxyprogesterone solution for injection What is this medicine? HYDROXYPROGESTERONE (hye drox ee proe JES ter one) is a female hormone. This medicine is used in women who are pregnant and who have delivered a baby too early (preterm) in the past. It helps lower the risk of having a preterm baby again. This medicine may be used for other purposes; ask your health care provider or pharmacist if you have questions. COMMON BRAND NAME(S): Makena What should I tell my health care provider before I take this medicine? They need to know if you have any of these conditions: -blood clotting disorders -breast, cervical, uterine, or vaginal cancer -depression -diabetes or prediabetes -heart disease -high blood pressure -kidney disease -liver disease -lung or breathing disease, like asthma -migraine headaches -seizures -vaginal bleeding -an unusual or allergic reaction to hydroxyprogesterone, other hormones, medicines, foods, dyes, castor oil, benzyl alcohol, or other preservatives -breast-feeding How should I use this medicine? This medicine is for injection into a muscle. It is given by a health care professional in a hospital or clinic setting. You are likely to get an injection once a week to prevent preterm delivery. Talk to your pediatrician regarding the use of this medicine in children. Special care may be needed. Overdosage: If you think you have taken too much of this medicine contact a poison control center or emergency room at once. NOTE: This medicine is only for you. Do not share this medicine with others. What if I miss a dose? It is important not to miss your dose. Call your doctor or health care professional if you are unable to keep an appointment. What may interact with this medicine? -acetaminophen -bupropion -clozapine -efavirenz -halothane -methadone -nicotine -theophylline, aminophylline -tizanidine This list may not describe all possible interactions. Give your  health care provider a list of all the medicines, herbs, non-prescription drugs, or dietary supplements you use. Also tell them if you smoke, drink alcohol, or use illegal drugs. Some items may interact with your medicine. What should I watch for while using this medicine? Your condition will be monitored carefully while you are receiving this medicine. What side effects may I notice from receiving this medicine? Side effects that you should report to your doctor or health care professional as soon as possible: -allergic reactions like skin rash, itching or hives, swelling of the face, lips, or tongue -breathing problems -breast tissue changes or discharge -changes in vision -confusion, trouble speaking or understanding -depressed mood -increased hunger or thirst -increased urination -pain, redness, or irritation at site where injected -pain, swelling, warmth in the leg -shortness of breath, chest pain, swelling in a leg -sudden numbness or weakness of the face, arm or leg -sudden severe headaches -trouble walking, dizziness, loss of balance or coordination -unusually weak or tired -vaginal bleeding -yellowing of the eyes or skin Side effects that usually do not require medical attention (report to your doctor or health care professional if they continue or are bothersome): -changes in emotions or moods -diarrhea -fluid retention and swelling -nausea This list may not describe all possible side effects. Call your doctor for medical advice about side effects. You may report side effects to FDA at 1-800-FDA-1088. Where should I keep my medicine? This drug is given in a hospital or clinic and will not be stored at home. NOTE: This sheet is a summary. It may not cover all possible information. If you have questions about this medicine, talk to your doctor, pharmacist, or health care provider.  2018 Elsevier/Gold Standard (2009-06-28 11:17:12)  

## 2016-10-25 NOTE — Progress Notes (Signed)
Scheduled patient an appointment for a Fetal Echo 11/24/2016 at 9:30, with Dr.Cotton.

## 2016-10-26 LAB — OBSTETRIC PANEL, INCLUDING HIV
Antibody Screen: NEGATIVE
BASOS ABS: 0.1 10*3/uL (ref 0.0–0.2)
Basos: 0 %
EOS (ABSOLUTE): 0.1 10*3/uL (ref 0.0–0.4)
Eos: 1 %
HIV SCREEN 4TH GENERATION: NONREACTIVE
Hematocrit: 35.3 % (ref 34.0–46.6)
Hemoglobin: 11.7 g/dL (ref 11.1–15.9)
Hepatitis B Surface Ag: NEGATIVE
Immature Grans (Abs): 0.2 10*3/uL — ABNORMAL HIGH (ref 0.0–0.1)
Immature Granulocytes: 2 %
LYMPHS: 19 %
Lymphocytes Absolute: 2.8 10*3/uL (ref 0.7–3.1)
MCH: 31.3 pg (ref 26.6–33.0)
MCHC: 33.1 g/dL (ref 31.5–35.7)
MCV: 94 fL (ref 79–97)
MONOCYTES: 7 %
Monocytes Absolute: 1 10*3/uL — ABNORMAL HIGH (ref 0.1–0.9)
Neutrophils Absolute: 10.5 10*3/uL — ABNORMAL HIGH (ref 1.4–7.0)
Neutrophils: 71 %
PLATELETS: 218 10*3/uL (ref 150–379)
RBC: 3.74 x10E6/uL — AB (ref 3.77–5.28)
RDW: 13.3 % (ref 12.3–15.4)
RPR Ser Ql: NONREACTIVE
RUBELLA: 2.48 {index} (ref >0.99)
Rh Factor: POSITIVE
WBC: 14.6 10*3/uL — ABNORMAL HIGH (ref 3.4–10.8)

## 2016-10-27 LAB — CULTURE, OB URINE

## 2016-10-27 LAB — URINE CULTURE, OB REFLEX: Organism ID, Bacteria: NO GROWTH

## 2016-10-30 ENCOUNTER — Encounter: Payer: Self-pay | Admitting: Family Medicine

## 2016-10-30 DIAGNOSIS — R87619 Unspecified abnormal cytological findings in specimens from cervix uteri: Secondary | ICD-10-CM | POA: Insufficient documentation

## 2016-10-30 LAB — CYTOLOGY - PAP
Adequacy: ABSENT
CHLAMYDIA, DNA PROBE: NEGATIVE
Diagnosis: NEGATIVE
HPV (WINDOPATH): DETECTED — AB
HPV 16/18/45 genotyping: NEGATIVE
NEISSERIA GONORRHEA: NEGATIVE

## 2016-10-30 LAB — SPECIMEN STATUS REPORT

## 2016-10-30 NOTE — Telephone Encounter (Signed)
error 

## 2016-11-02 ENCOUNTER — Telehealth: Payer: Self-pay | Admitting: *Deleted

## 2016-11-02 NOTE — Telephone Encounter (Signed)
[name] from [facility] called regarding this patient. They are dispensing one complimentary auto injector for her, however they need regular rx and to get some info for delivery. Please call [phone].

## 2016-11-05 ENCOUNTER — Telehealth: Payer: Self-pay | Admitting: *Deleted

## 2016-11-05 NOTE — Telephone Encounter (Signed)
See telephone call dated 11/05/16 re: Gloria Weber

## 2016-11-05 NOTE — Telephone Encounter (Signed)
Received a voicemail from 6;15/18 pm stating need to confirm intake for makena - has been authorized to receive 1 auto injector- need verbal order and to confirm shipment- states vials were ordered but are on back order. I called [facility] and gave verbal order and confirmed shipment information. She states they will continue to try to get approval for injections. Auto injector should arrive 11/07/16.

## 2016-11-06 ENCOUNTER — Telehealth: Payer: Self-pay

## 2016-11-06 DIAGNOSIS — Q249 Congenital malformation of heart, unspecified: Secondary | ICD-10-CM

## 2016-11-06 NOTE — Telephone Encounter (Signed)
Cover for personal history of congential heart defect and preterm delivery.

## 2016-11-06 NOTE — Telephone Encounter (Signed)
Per MFM patient ultrasound will need to change current order to [phone] to [phone] so that it will be able to

## 2016-11-07 ENCOUNTER — Telehealth: Payer: Self-pay | Admitting: *Deleted

## 2016-11-07 NOTE — Telephone Encounter (Signed)
Shipment will be ship out today 11/07/2016

## 2016-11-07 NOTE — Telephone Encounter (Signed)
[name] with [facility] called requesting a callback regarding patients makena. She can be reached at [phone] ext [phone].

## 2016-11-09 ENCOUNTER — Encounter (HOSPITAL_COMMUNITY): Payer: Self-pay

## 2016-11-09 ENCOUNTER — Ambulatory Visit (HOSPITAL_COMMUNITY)
Admission: RE | Admit: 2016-11-09 | Discharge: 2016-11-09 | Disposition: A | Discharge status: Home / Self Care | Payer: 59 | Source: Ambulatory Visit | Attending: Family Medicine | Admitting: Family Medicine

## 2016-11-09 ENCOUNTER — Other Ambulatory Visit: Payer: Self-pay | Admitting: Family Medicine

## 2016-11-09 DIAGNOSIS — O99352 Diseases of the nervous system complicating pregnancy, second trimester: Secondary | ICD-10-CM | POA: Insufficient documentation

## 2016-11-09 DIAGNOSIS — Z8279 Family history of other congenital malformations, deformations and chromosomal abnormalities: Secondary | ICD-10-CM

## 2016-11-09 DIAGNOSIS — O09899 Supervision of other high risk pregnancies, unspecified trimester: Secondary | ICD-10-CM

## 2016-11-09 DIAGNOSIS — Q249 Congenital malformation of heart, unspecified: Secondary | ICD-10-CM

## 2016-11-09 DIAGNOSIS — Z369 Encounter for antenatal screening, unspecified: Secondary | ICD-10-CM

## 2016-11-09 DIAGNOSIS — O09212 Supervision of pregnancy with history of pre-term labor, second trimester: Secondary | ICD-10-CM | POA: Insufficient documentation

## 2016-11-09 DIAGNOSIS — Z3A20 20 weeks gestation of pregnancy: Secondary | ICD-10-CM

## 2016-11-09 DIAGNOSIS — O0990 Supervision of high risk pregnancy, unspecified, unspecified trimester: Secondary | ICD-10-CM

## 2016-11-09 DIAGNOSIS — O3440 Maternal care for other abnormalities of cervix, unspecified trimester: Secondary | ICD-10-CM

## 2016-11-09 DIAGNOSIS — O09219 Supervision of pregnancy with history of pre-term labor, unspecified trimester: Secondary | ICD-10-CM

## 2016-11-09 DIAGNOSIS — Z8774 Personal history of (corrected) congenital malformations of heart and circulatory system: Secondary | ICD-10-CM

## 2016-11-09 DIAGNOSIS — G930 Cerebral cysts: Secondary | ICD-10-CM | POA: Insufficient documentation

## 2016-11-09 DIAGNOSIS — Z9889 Other specified postprocedural states: Secondary | ICD-10-CM

## 2016-11-12 ENCOUNTER — Other Ambulatory Visit (HOSPITAL_COMMUNITY): Payer: Self-pay | Admitting: *Deleted

## 2016-11-12 DIAGNOSIS — O09299 Supervision of pregnancy with other poor reproductive or obstetric history, unspecified trimester: Secondary | ICD-10-CM

## 2016-11-12 DIAGNOSIS — O09219 Supervision of pregnancy with history of pre-term labor, unspecified trimester: Secondary | ICD-10-CM

## 2016-11-22 ENCOUNTER — Encounter: Payer: Self-pay | Admitting: Obstetrics & Gynecology

## 2016-11-22 ENCOUNTER — Ambulatory Visit (INDEPENDENT_AMBULATORY_CARE_PROVIDER_SITE_OTHER): Payer: 59 | Admitting: Obstetrics & Gynecology

## 2016-11-22 VITALS — BP 109/63 | HR 80 | Wt 169.7 lb

## 2016-11-22 DIAGNOSIS — F1120 Opioid dependence, uncomplicated: Secondary | ICD-10-CM

## 2016-11-22 DIAGNOSIS — M5431 Sciatica, right side: Secondary | ICD-10-CM

## 2016-11-22 DIAGNOSIS — F909 Attention-deficit hyperactivity disorder, unspecified type: Secondary | ICD-10-CM

## 2016-11-22 DIAGNOSIS — O99320 Drug use complicating pregnancy, unspecified trimester: Secondary | ICD-10-CM | POA: Insufficient documentation

## 2016-11-22 DIAGNOSIS — O09219 Supervision of pregnancy with history of pre-term labor, unspecified trimester: Principal | ICD-10-CM

## 2016-11-22 DIAGNOSIS — O09212 Supervision of pregnancy with history of pre-term labor, second trimester: Secondary | ICD-10-CM

## 2016-11-22 DIAGNOSIS — O09899 Supervision of other high risk pregnancies, unspecified trimester: Secondary | ICD-10-CM

## 2016-11-22 DIAGNOSIS — M5430 Sciatica, unspecified side: Secondary | ICD-10-CM | POA: Insufficient documentation

## 2016-11-22 DIAGNOSIS — O0992 Supervision of high risk pregnancy, unspecified, second trimester: Secondary | ICD-10-CM

## 2016-11-22 DIAGNOSIS — O0990 Supervision of high risk pregnancy, unspecified, unspecified trimester: Secondary | ICD-10-CM

## 2016-11-22 DIAGNOSIS — IMO0002 Reserved for concepts with insufficient information to code with codable children: Secondary | ICD-10-CM

## 2016-11-22 DIAGNOSIS — O350XX0 Maternal care for (suspected) central nervous system malformation in fetus, not applicable or unspecified: Secondary | ICD-10-CM

## 2016-11-22 LAB — POCT URINALYSIS DIP (DEVICE)
Bilirubin Urine: NEGATIVE
GLUCOSE, UA: NEGATIVE mg/dL
Hgb urine dipstick: NEGATIVE
KETONES UR: NEGATIVE mg/dL
Nitrite: NEGATIVE
PROTEIN: NEGATIVE mg/dL
Specific Gravity, Urine: 1.025 (ref 1.005–1.030)
Urobilinogen, UA: 0.2 mg/dL (ref 0.0–1.0)
pH: 6.5 (ref 5.0–8.0)

## 2016-11-22 MED ORDER — TRAMADOL HCL 50 MG PO TABS: 100.0000 mg | ORAL_TABLET | Freq: Four times a day (QID) | ORAL | 0 refills | Status: DC | PRN

## 2016-11-22 MED ORDER — METHYLPHENIDATE HCL 20 MG PO TABS: 60.0000 mg | ORAL_TABLET | Freq: Every day | ORAL | 0 refills | Status: DC

## 2016-11-22 MED ORDER — HYDROXYPROGESTERONE CAPROATE 275 MG/1.1ML ~~LOC~~ SOAJ
275.0000 mg | SUBCUTANEOUS | Status: DC
  Administered 2016-11-22: 275 mg via SUBCUTANEOUS

## 2016-11-22 NOTE — Patient Instructions (Signed)
Return to clinic for any scheduled appointments or obstetric concerns, or go to [facility] for evaluation  

## 2016-11-22 NOTE — Progress Notes (Signed)
   PRENATAL VISIT NOTE  Subjective:  Gloria Weber is a 31 y.o. G3P1102 at 1269w6d being seen today for ongoing prenatal care.  She is currently monitored for the following issues for this high-risk pregnancy and has Supervision of high risk pregnancy, antepartum; History of LEEP (loop electrosurgical excision procedure) of cervix complicating pregnancy, unspecified trimester; History of preterm delivery, currently pregnant, unspecified trimester; History of congenital heart defect; Abnormal Pap smear of cervix; Bilateral choroid plexus cyst of fetus on prenatal ultrasound; Adult ADHD; Sciatica; Opioid dependence (HCC); and Drug dependence affecting pregnancy, antepartum on her problem list.  Patient reports back pain; desires medication. Also wants refill of Ritalin.  Contractions: Not present. Vag. Bleeding: Scant.  Movement: Present. Denies leaking of fluid.   The following portions of the patient's history were reviewed and updated as appropriate: allergies, current medications, past family history, past medical history, past social history, past surgical history and problem list. Problem list updated.  Objective:   Vitals:   11/22/16 0837  BP: 109/63  Pulse: 80  Weight: 169 lb 11.2 oz (77 kg)    Fetal Status: Fetal Heart Rate (bpm): 145   Movement: Present     General:  Alert, oriented and cooperative. Patient is in no acute distress.  Skin: Skin is warm and dry. No rash noted.   Cardiovascular: Normal heart rate noted  Respiratory: Normal respiratory effort, no problems with respiration noted  Abdomen: Soft, gravid, appropriate for gestational age. Pain/Pressure: Present     Pelvic:  Cervical exam deferred        Extremities: Normal range of motion.  Edema: Trace  Mental Status: Normal mood and affect. Normal behavior. Normal judgment and thought content.   Assessment and Plan:  Pregnancy: [id number] at [redacted]w[redacted]d  1. Adult ADHD Ritalin Rx filled for her - methylphenidate  (RITALIN) 20 MG tablet; Take 3 tablets (60 mg total) by mouth daily.  Dispense: 180 tablet; Refill: 0  2. Opioid dependence (HCC) 3. Drug dependence affecting pregnancy, antepartum 4. Sciatica of right side Under pain management provider (Dr. Jeannetta NapElkins); usually was on Vicodin and Tramadol prior to pregnancy. Since pregnancy, just on Tramadol. Takes about eight a day.  Gets 240 tablets of Tramadol Rx monthly. Last month got #90  7/5 Letter given for Dr. Shelah LewandowskyElkin to continue pain management during pregnancy. Will follow his recommendations. Rx for #60 given for now - traMADol (ULTRAM) 50 MG tablet; Take 2 tablets (100 mg total) by mouth every 6 (six) hours as needed for severe pain.  Dispense: 60 tablet; Refill: 0 - [facility] RN referral done   5. History of preterm delivery, currently pregnant, unspecified trimester Continue weekly 17P - AFP TETRA - HYDROXYprogesterone Caproate SOAJ 275 mg; Inject 275 mg into the skin once a week.  6. Bilateral choroid plexus cyst of fetus on prenatal ultrasound Quad screen done today. Repeat scan on 12/07/16. - AFP TETRA  7. Supervision of high risk pregnancy, antepartum Preterm labor symptoms and general obstetric precautions including but not limited to vaginal bleeding, contractions, leaking of fluid and fetal movement were reviewed in detail with the patient. Please refer to After Visit Summary for other counseling recommendations.  Return in about 1 week (around 11/29/2016) for 17P only.  17P only in 2 and 3 weeks too. 4 weeks: 17P and HOB.   Jaynie CollinsUgonna Taytum Wheller, MD

## 2016-11-23 ENCOUNTER — Encounter (HOSPITAL_COMMUNITY): Payer: Self-pay

## 2016-11-23 ENCOUNTER — Ambulatory Visit (HOSPITAL_COMMUNITY)
Admission: RE | Admit: 2016-11-23 | Discharge: 2016-11-23 | Disposition: A | Discharge status: Home / Self Care | Payer: 59 | Source: Ambulatory Visit | Attending: Obstetrics & Gynecology | Admitting: Obstetrics & Gynecology

## 2016-11-23 DIAGNOSIS — Z3A22 22 weeks gestation of pregnancy: Secondary | ICD-10-CM | POA: Diagnosis not present

## 2016-11-23 DIAGNOSIS — Z8279 Family history of other congenital malformations, deformations and chromosomal abnormalities: Secondary | ICD-10-CM | POA: Diagnosis not present

## 2016-11-23 DIAGNOSIS — F909 Attention-deficit hyperactivity disorder, unspecified type: Secondary | ICD-10-CM

## 2016-11-23 DIAGNOSIS — F1120 Opioid dependence, uncomplicated: Secondary | ICD-10-CM

## 2016-11-23 DIAGNOSIS — O09219 Supervision of pregnancy with history of pre-term labor, unspecified trimester: Secondary | ICD-10-CM

## 2016-11-23 DIAGNOSIS — O09212 Supervision of pregnancy with history of pre-term labor, second trimester: Secondary | ICD-10-CM | POA: Insufficient documentation

## 2016-11-23 DIAGNOSIS — M5431 Sciatica, right side: Secondary | ICD-10-CM

## 2016-11-23 DIAGNOSIS — O99320 Drug use complicating pregnancy, unspecified trimester: Secondary | ICD-10-CM

## 2016-11-28 LAB — AFP TETRA
DIA Mom Value: 2.84
DIA VALUE (EIA): 698.29 pg/mL
DSR (By Age)    1 IN: 444
DSR (SECOND TRIMESTER) 1 IN: 375
Gestational Age: 21.9 WEEKS
MSAFP MOM: 1.37
MSAFP: 92.3 ng/mL
MSHCG Mom: 1.41
MSHCG: 29360 m[IU]/mL
Maternal Age At EDD: 33 yr
OSB RISK: 3920
Test Results:: NEGATIVE
UE3 MOM: 1.5
UE3 VALUE: 3.36 ng/mL
Weight: 169 [lb_av]

## 2016-11-29 ENCOUNTER — Ambulatory Visit (INDEPENDENT_AMBULATORY_CARE_PROVIDER_SITE_OTHER): Payer: 59 | Admitting: General Practice

## 2016-11-29 VITALS — BP 121/72 | HR 97 | Ht 62.0 in | Wt 169.0 lb

## 2016-11-29 DIAGNOSIS — O09212 Supervision of pregnancy with history of pre-term labor, second trimester: Secondary | ICD-10-CM

## 2016-11-29 DIAGNOSIS — O09899 Supervision of other high risk pregnancies, unspecified trimester: Secondary | ICD-10-CM

## 2016-11-29 DIAGNOSIS — O09219 Supervision of pregnancy with history of pre-term labor, unspecified trimester: Principal | ICD-10-CM

## 2016-11-29 MED ORDER — HYDROXYPROGESTERONE CAPROATE 250 MG/ML IM OIL
250.0000 mg | TOPICAL_OIL | INTRAMUSCULAR | Status: DC
  Administered 2016-11-29 – 2017-02-18 (×9): 250 mg via INTRAMUSCULAR

## 2016-11-30 ENCOUNTER — Telehealth: Payer: Self-pay | Admitting: *Deleted

## 2016-11-30 NOTE — Telephone Encounter (Signed)
Pt left message yesterday @ 1250 requesting results of Quad screen test.

## 2016-12-03 NOTE — Telephone Encounter (Signed)
Called patient & informed her of negative quad screen and that she will need a repeat pap in one year due to +HPV. Patient verbalized understanding to all & had no questions

## 2016-12-06 ENCOUNTER — Ambulatory Visit (INDEPENDENT_AMBULATORY_CARE_PROVIDER_SITE_OTHER): Payer: 59 | Admitting: *Deleted

## 2016-12-06 VITALS — BP 103/66 | HR 98 | Wt 177.0 lb

## 2016-12-06 DIAGNOSIS — O09219 Supervision of pregnancy with history of pre-term labor, unspecified trimester: Principal | ICD-10-CM

## 2016-12-06 DIAGNOSIS — O09899 Supervision of other high risk pregnancies, unspecified trimester: Secondary | ICD-10-CM

## 2016-12-06 DIAGNOSIS — O09212 Supervision of pregnancy with history of pre-term labor, second trimester: Secondary | ICD-10-CM | POA: Diagnosis not present

## 2016-12-06 NOTE — Progress Notes (Signed)
Makena 250 mg IM administered as scheduled.  Pt tolerated well.  

## 2016-12-07 ENCOUNTER — Encounter (HOSPITAL_COMMUNITY): Payer: Self-pay

## 2016-12-07 ENCOUNTER — Other Ambulatory Visit (HOSPITAL_COMMUNITY): Payer: Self-pay | Admitting: Maternal and Fetal Medicine

## 2016-12-07 ENCOUNTER — Ambulatory Visit (HOSPITAL_COMMUNITY)
Admission: RE | Admit: 2016-12-07 | Discharge: 2016-12-07 | Disposition: A | Discharge status: Home / Self Care | Payer: 59 | Source: Ambulatory Visit | Attending: Obstetrics & Gynecology | Admitting: Obstetrics & Gynecology

## 2016-12-07 DIAGNOSIS — Z362 Encounter for other antenatal screening follow-up: Secondary | ICD-10-CM

## 2016-12-07 DIAGNOSIS — O99320 Drug use complicating pregnancy, unspecified trimester: Secondary | ICD-10-CM

## 2016-12-07 DIAGNOSIS — O09212 Supervision of pregnancy with history of pre-term labor, second trimester: Secondary | ICD-10-CM | POA: Insufficient documentation

## 2016-12-07 DIAGNOSIS — O09299 Supervision of pregnancy with other poor reproductive or obstetric history, unspecified trimester: Secondary | ICD-10-CM

## 2016-12-07 DIAGNOSIS — Z3A24 24 weeks gestation of pregnancy: Secondary | ICD-10-CM

## 2016-12-07 DIAGNOSIS — M5431 Sciatica, right side: Secondary | ICD-10-CM

## 2016-12-07 DIAGNOSIS — F909 Attention-deficit hyperactivity disorder, unspecified type: Secondary | ICD-10-CM

## 2016-12-07 DIAGNOSIS — F1120 Opioid dependence, uncomplicated: Secondary | ICD-10-CM

## 2016-12-07 DIAGNOSIS — O09219 Supervision of pregnancy with history of pre-term labor, unspecified trimester: Secondary | ICD-10-CM

## 2016-12-07 DIAGNOSIS — O09292 Supervision of pregnancy with other poor reproductive or obstetric history, second trimester: Secondary | ICD-10-CM | POA: Diagnosis not present

## 2016-12-10 ENCOUNTER — Other Ambulatory Visit (HOSPITAL_COMMUNITY): Payer: Self-pay | Admitting: *Deleted

## 2016-12-10 DIAGNOSIS — Z8751 Personal history of pre-term labor: Secondary | ICD-10-CM

## 2016-12-13 ENCOUNTER — Ambulatory Visit: Payer: 59

## 2016-12-14 ENCOUNTER — Ambulatory Visit (INDEPENDENT_AMBULATORY_CARE_PROVIDER_SITE_OTHER): Payer: 59 | Admitting: Lab

## 2016-12-14 VITALS — BP 116/70 | HR 91 | Wt 174.1 lb

## 2016-12-14 DIAGNOSIS — O09212 Supervision of pregnancy with history of pre-term labor, second trimester: Secondary | ICD-10-CM

## 2016-12-14 DIAGNOSIS — O0990 Supervision of high risk pregnancy, unspecified, unspecified trimester: Secondary | ICD-10-CM

## 2016-12-14 MED ORDER — HYDROXYPROGESTERONE CAPROATE 250 MG/ML IM OIL
250.0000 mg | TOPICAL_OIL | Freq: Once | INTRAMUSCULAR | Status: AC
  Administered 2016-12-14: 250 mg via INTRAMUSCULAR

## 2016-12-14 NOTE — Progress Notes (Signed)
Patient here today for 17-P, Patient tolerated well.

## 2016-12-20 ENCOUNTER — Ambulatory Visit (INDEPENDENT_AMBULATORY_CARE_PROVIDER_SITE_OTHER): Payer: 59 | Admitting: Obstetrics and Gynecology

## 2016-12-20 VITALS — BP 108/72 | HR 84 | Wt 178.1 lb

## 2016-12-20 DIAGNOSIS — O09212 Supervision of pregnancy with history of pre-term labor, second trimester: Secondary | ICD-10-CM

## 2016-12-20 DIAGNOSIS — O3442 Maternal care for other abnormalities of cervix, second trimester: Secondary | ICD-10-CM

## 2016-12-20 DIAGNOSIS — O350XX0 Maternal care for (suspected) central nervous system malformation in fetus, not applicable or unspecified: Secondary | ICD-10-CM

## 2016-12-20 DIAGNOSIS — O3440 Maternal care for other abnormalities of cervix, unspecified trimester: Secondary | ICD-10-CM

## 2016-12-20 DIAGNOSIS — N898 Other specified noninflammatory disorders of vagina: Secondary | ICD-10-CM | POA: Insufficient documentation

## 2016-12-20 DIAGNOSIS — O0992 Supervision of high risk pregnancy, unspecified, second trimester: Secondary | ICD-10-CM

## 2016-12-20 DIAGNOSIS — Z9889 Other specified postprocedural states: Secondary | ICD-10-CM

## 2016-12-20 DIAGNOSIS — O09219 Supervision of pregnancy with history of pre-term labor, unspecified trimester: Secondary | ICD-10-CM

## 2016-12-20 DIAGNOSIS — Z113 Encounter for screening for infections with a predominantly sexual mode of transmission: Secondary | ICD-10-CM

## 2016-12-20 DIAGNOSIS — Z3009 Encounter for other general counseling and advice on contraception: Secondary | ICD-10-CM | POA: Insufficient documentation

## 2016-12-20 DIAGNOSIS — O09899 Supervision of other high risk pregnancies, unspecified trimester: Secondary | ICD-10-CM

## 2016-12-20 DIAGNOSIS — O0990 Supervision of high risk pregnancy, unspecified, unspecified trimester: Secondary | ICD-10-CM

## 2016-12-20 DIAGNOSIS — Z8774 Personal history of (corrected) congenital malformations of heart and circulatory system: Secondary | ICD-10-CM

## 2016-12-20 DIAGNOSIS — F1120 Opioid dependence, uncomplicated: Secondary | ICD-10-CM

## 2016-12-20 DIAGNOSIS — IMO0002 Reserved for concepts with insufficient information to code with codable children: Secondary | ICD-10-CM

## 2016-12-20 NOTE — Progress Notes (Signed)
Subjective:  Gloria Weber is a 31 y.o. G3P1102 at 2520w6d being seen today for ongoing prenatal care.  She is currently monitored for the following issues for this high-risk pregnancy and has Supervision of high risk pregnancy, antepartum; History of LEEP (loop electrosurgical excision procedure) of cervix complicating pregnancy, unspecified trimester; History of preterm delivery, currently pregnant, unspecified trimester; History of congenital heart defect; Abnormal Pap smear of cervix; Bilateral choroid plexus cyst of fetus on prenatal ultrasound; Adult ADHD; Sciatica; Opioid dependence (HCC); Drug dependence affecting pregnancy, antepartum; Vaginal discharge; and Unwanted fertility on her problem list.  Patient reports occasional contractions and vaginal discharge.  Contractions: Irregular. Vag. Bleeding: None.  Movement: Present. Denies leaking of fluid.   The following portions of the patient's history were reviewed and updated as appropriate: allergies, current medications, past family history, past medical history, past social history, past surgical history and problem list. Problem list updated.  Objective:   Vitals:   12/20/16 0900  BP: 108/72  Pulse: 84  Weight: 178 lb 1.6 oz (80.8 kg)    Fetal Status: Fetal Heart Rate (bpm): 151   Movement: Present     General:  Alert, oriented and cooperative. Patient is in no acute distress.  Skin: Skin is warm and dry. No rash noted.   Cardiovascular: Normal heart rate noted  Respiratory: Normal respiratory effort, no problems with respiration noted  Abdomen: Soft, gravid, appropriate for gestational age. Pain/Pressure: Present     Pelvic:  Cervical exam performed        Extremities: Normal range of motion.  Edema: Trace  Mental Status: Normal mood and affect. Normal behavior. Normal judgment and thought content.   Urinalysis:      Assessment and Plan:  Pregnancy: G3P1102 at 7820w6d  1. Supervision of high risk pregnancy,  antepartum Glucola next visit  2. History of preterm delivery, currently pregnant, unspecified trimester Stable on 17 OHP U/S for CL tomorrow  3. Uncomplicated opioid dependence (HCC) Stable Need to set up NICU tour at later date  4. History of LEEP (loop electrosurgical excision procedure) of cervix complicating pregnancy, unspecified trimester See above  5. History of congenital heart defect Normal fetal ECHO  6. Bilateral choroid plexus cyst of fetus on prenatal ultrasound Resolved on last U/S  7. Vaginal discharge  - Cervicovaginal ancillary only  8. Unwanted fertility Has private insurance but has applied for Medicaid as secondary Will need to sign tubal papers at next visit  Preterm labor symptoms and general obstetric precautions including but not limited to vaginal bleeding, contractions, leaking of fluid and fetal movement were reviewed in detail with the patient. Please refer to After Visit Summary for other counseling recommendations.  Return in about 2 weeks (around 01/03/2017) for OB visit.   Hermina StaggersErvin, Becka Lagasse L, MD

## 2016-12-21 ENCOUNTER — Encounter (HOSPITAL_COMMUNITY): Payer: Self-pay

## 2016-12-21 ENCOUNTER — Ambulatory Visit (HOSPITAL_COMMUNITY)
Admission: RE | Admit: 2016-12-21 | Discharge: 2016-12-21 | Disposition: A | Discharge status: Home / Self Care | Payer: 59 | Source: Ambulatory Visit | Attending: Obstetrics & Gynecology | Admitting: Obstetrics & Gynecology

## 2016-12-21 DIAGNOSIS — O99320 Drug use complicating pregnancy, unspecified trimester: Secondary | ICD-10-CM

## 2016-12-21 DIAGNOSIS — F909 Attention-deficit hyperactivity disorder, unspecified type: Secondary | ICD-10-CM

## 2016-12-21 DIAGNOSIS — M5431 Sciatica, right side: Secondary | ICD-10-CM

## 2016-12-21 DIAGNOSIS — F1120 Opioid dependence, uncomplicated: Secondary | ICD-10-CM

## 2016-12-21 DIAGNOSIS — Z8751 Personal history of pre-term labor: Secondary | ICD-10-CM

## 2016-12-21 LAB — CERVICOVAGINAL ANCILLARY ONLY
BACTERIAL VAGINITIS: NEGATIVE
Candida vaginitis: POSITIVE — AB
Chlamydia: NEGATIVE
NEISSERIA GONORRHEA: NEGATIVE
Trichomonas: NEGATIVE

## 2016-12-21 NOTE — ED Notes (Signed)
Pt here in [facility] for appt.  States last vaginal spotting was 2 days ago.  Pt reports more wetness in her panties which requires her to change her panties in the middle of the day.  Reported the clear to white discharge to her primary OB yesterday.   Pt reports swab taken but didn't know the results.

## 2016-12-26 ENCOUNTER — Telehealth: Payer: Self-pay

## 2016-12-26 MED ORDER — TERCONAZOLE 0.4 % VA CREA: 1.0000 | TOPICAL_CREAM | Freq: Every day | VAGINAL | 0 refills | Status: DC

## 2016-12-26 NOTE — Telephone Encounter (Signed)
-----   Message from Gloria StaggersMichael L Ervin, MD sent at 12/25/2016  9:39 AM EDT ----- Please treat pt's yeast infection with Terazol 1 applicator qhs x 7 days Thanks Casimiro NeedleMichael

## 2016-12-26 NOTE — Telephone Encounter (Signed)
Called patient inform her of yeast infection and need to start Terazol cream. Called into [facility].

## 2017-01-04 ENCOUNTER — Other Ambulatory Visit (HOSPITAL_COMMUNITY): Payer: Self-pay | Admitting: Maternal and Fetal Medicine

## 2017-01-04 ENCOUNTER — Ambulatory Visit (HOSPITAL_COMMUNITY)
Admission: RE | Admit: 2017-01-04 | Discharge: 2017-01-04 | Disposition: A | Discharge status: Home / Self Care | Payer: 59 | Source: Ambulatory Visit | Attending: Obstetrics & Gynecology | Admitting: Obstetrics & Gynecology

## 2017-01-04 ENCOUNTER — Encounter (HOSPITAL_COMMUNITY): Payer: Self-pay

## 2017-01-04 ENCOUNTER — Ambulatory Visit (INDEPENDENT_AMBULATORY_CARE_PROVIDER_SITE_OTHER): Payer: 59 | Admitting: Obstetrics & Gynecology

## 2017-01-04 DIAGNOSIS — Z3A28 28 weeks gestation of pregnancy: Secondary | ICD-10-CM | POA: Diagnosis not present

## 2017-01-04 DIAGNOSIS — O09213 Supervision of pregnancy with history of pre-term labor, third trimester: Secondary | ICD-10-CM | POA: Diagnosis not present

## 2017-01-04 DIAGNOSIS — O3443 Maternal care for other abnormalities of cervix, third trimester: Secondary | ICD-10-CM | POA: Diagnosis not present

## 2017-01-04 DIAGNOSIS — O99322 Drug use complicating pregnancy, second trimester: Secondary | ICD-10-CM

## 2017-01-04 DIAGNOSIS — Z23 Encounter for immunization: Secondary | ICD-10-CM | POA: Diagnosis not present

## 2017-01-04 DIAGNOSIS — O350XX0 Maternal care for (suspected) central nervous system malformation in fetus, not applicable or unspecified: Secondary | ICD-10-CM

## 2017-01-04 DIAGNOSIS — IMO0002 Reserved for concepts with insufficient information to code with codable children: Secondary | ICD-10-CM

## 2017-01-04 DIAGNOSIS — Z8751 Personal history of pre-term labor: Secondary | ICD-10-CM

## 2017-01-04 DIAGNOSIS — O09212 Supervision of pregnancy with history of pre-term labor, second trimester: Secondary | ICD-10-CM | POA: Diagnosis not present

## 2017-01-04 DIAGNOSIS — F1920 Other psychoactive substance dependence, uncomplicated: Secondary | ICD-10-CM | POA: Insufficient documentation

## 2017-01-04 DIAGNOSIS — O99323 Drug use complicating pregnancy, third trimester: Secondary | ICD-10-CM | POA: Diagnosis not present

## 2017-01-04 NOTE — Progress Notes (Signed)
   PRENATAL VISIT NOTE  Subjective:  Gloria Weber is a 31 y.o. G3P1102 at [redacted]w[redacted]d being seen today for ongoing prenatal care.  She is currently monitored for the following issues for this high-risk pregnancy and has Supervision of high risk pregnancy, antepartum; History of LEEP (loop electrosurgical excision procedure) of cervix complicating pregnancy, unspecified trimester; History of preterm delivery, currently pregnant, unspecified trimester; History of congenital heart defect; Abnormal Pap smear of cervix; Adult ADHD; Sciatica; Opioid dependence (HCC); Drug dependence affecting pregnancy, antepartum; Vaginal discharge; and Unwanted fertility on her problem list.  Patient reports backache.  Contractions: Irregular. Vag. Bleeding: None.  Movement: Present. Denies leaking of fluid.   The following portions of the patient's history were reviewed and updated as appropriate: allergies, current medications, past family history, past medical history, past social history, past surgical history and problem list. Problem list updated.  Objective:   Vitals:   01/04/17 0852  BP: 117/71  Pulse: 97  Weight: 179 lb 4.8 oz (81.3 kg)    Fetal Status: Fetal Heart Rate (bpm): 145   Movement: Present     General:  Alert, oriented and cooperative. Patient is in no acute distress.  Skin: Skin is warm and dry. No rash noted.   Cardiovascular: Normal heart rate noted  Respiratory: Normal respiratory effort, no problems with respiration noted  Abdomen: Soft, gravid, appropriate for gestational age.  Pain/Pressure: Present     Pelvic: Cervical exam deferred        Extremities: Normal range of motion.  Edema: Trace  Mental Status:  Normal mood and affect. Normal behavior. Normal judgment and thought content.   Assessment and Plan:  Pregnancy: G3P1102 at [redacted]w[redacted]d  1. Bilateral choroid plexus cyst of fetus on prenatal ultrasound Routine tests - Glucose Tolerance, 2 Hours w/1 Hour - RPR - HIV antibody -  CBC - Tdap vaccine greater than or equal to 7yo IM  Preterm labor symptoms and general obstetric precautions including but not limited to vaginal bleeding, contractions, leaking of fluid and fetal movement were reviewed in detail with the patient. Please refer to After Visit Summary for other counseling recommendations.  2 weeks, continue Makena [place] today Scheryl Darter, MD

## 2017-01-04 NOTE — Patient Instructions (Signed)

## 2017-01-05 LAB — GLUCOSE TOLERANCE, 2 HOURS W/ 1HR
GLUCOSE, FASTING: 68 mg/dL (ref 65–91)
Glucose, 1 hour: 98 mg/dL (ref 65–179)
Glucose, 2 hour: 101 mg/dL (ref 65–152)

## 2017-01-05 LAB — CBC
HEMATOCRIT: 38.1 % (ref 34.0–46.6)
HEMOGLOBIN: 12.2 g/dL (ref 11.1–15.9)
MCH: 30.8 pg (ref 26.6–33.0)
MCHC: 32 g/dL (ref 31.5–35.7)
MCV: 96 fL (ref 79–97)
Platelets: 221 10*3/uL (ref 150–379)
RBC: 3.96 x10E6/uL (ref 3.77–5.28)
RDW: 13.2 % (ref 12.3–15.4)
WBC: 18.4 10*3/uL — ABNORMAL HIGH (ref 3.4–10.8)

## 2017-01-05 LAB — HIV ANTIBODY (ROUTINE TESTING W REFLEX): HIV Screen 4th Generation wRfx: NONREACTIVE

## 2017-01-05 LAB — RPR: RPR Ser Ql: NONREACTIVE

## 2017-01-07 ENCOUNTER — Other Ambulatory Visit: Payer: Self-pay | Admitting: Obstetrics & Gynecology

## 2017-01-07 ENCOUNTER — Other Ambulatory Visit (HOSPITAL_COMMUNITY): Payer: Self-pay | Admitting: *Deleted

## 2017-01-07 DIAGNOSIS — Z3689 Encounter for other specified antenatal screening: Secondary | ICD-10-CM

## 2017-01-07 DIAGNOSIS — O99320 Drug use complicating pregnancy, unspecified trimester: Secondary | ICD-10-CM

## 2017-01-07 DIAGNOSIS — F1120 Opioid dependence, uncomplicated: Secondary | ICD-10-CM

## 2017-01-10 ENCOUNTER — Other Ambulatory Visit: Payer: Self-pay | Admitting: Obstetrics & Gynecology

## 2017-01-10 DIAGNOSIS — O99320 Drug use complicating pregnancy, unspecified trimester: Secondary | ICD-10-CM

## 2017-01-10 DIAGNOSIS — F1120 Opioid dependence, uncomplicated: Secondary | ICD-10-CM

## 2017-01-11 ENCOUNTER — Ambulatory Visit (INDEPENDENT_AMBULATORY_CARE_PROVIDER_SITE_OTHER): Payer: 59 | Admitting: *Deleted

## 2017-01-11 VITALS — BP 121/70 | HR 88

## 2017-01-11 DIAGNOSIS — O09213 Supervision of pregnancy with history of pre-term labor, third trimester: Secondary | ICD-10-CM | POA: Diagnosis not present

## 2017-01-11 DIAGNOSIS — O09899 Supervision of other high risk pregnancies, unspecified trimester: Secondary | ICD-10-CM

## 2017-01-11 DIAGNOSIS — O09219 Supervision of pregnancy with history of pre-term labor, unspecified trimester: Principal | ICD-10-CM

## 2017-01-11 NOTE — Progress Notes (Signed)
Patient presents to clinic for 17-p injection. Given and tolerated well.

## 2017-01-14 ENCOUNTER — Other Ambulatory Visit: Payer: Self-pay | Admitting: Obstetrics & Gynecology

## 2017-01-14 DIAGNOSIS — O99320 Drug use complicating pregnancy, unspecified trimester: Secondary | ICD-10-CM

## 2017-01-14 DIAGNOSIS — F1120 Opioid dependence, uncomplicated: Secondary | ICD-10-CM

## 2017-01-14 NOTE — Telephone Encounter (Signed)
Patient called and left message stating she needs a refill on her pain medication. Called patient and she states she hasn't received a pain refill since July. Patient states her pain doctor isn't wanting to take over her care since she is pregnant and is wanting [place] to see her and manage her pain. Told patient I am going to contact her pharmacy and see what the issue is because it looks like it was ordered last week. Patient verbalized understanding to all and had no questions at this time. Per review, Tramadol was ordered but never called in to pharmacy. Spoke with Dr Jolayne Panther who advises we sign a pain contract with the patient on Friday since her pain doctor isn't comfortable resuming care. Med called in to [facility]. Called patient back and informed her of med called in to pharmacy. Reviewed pain contract with patient & told her we will sign it with her on Friday. Patient verbalized understanding to all & had no questions

## 2017-01-18 ENCOUNTER — Ambulatory Visit (INDEPENDENT_AMBULATORY_CARE_PROVIDER_SITE_OTHER): Payer: 59 | Admitting: Obstetrics and Gynecology

## 2017-01-18 VITALS — BP 108/74 | HR 70 | Wt 181.0 lb

## 2017-01-18 DIAGNOSIS — O0993 Supervision of high risk pregnancy, unspecified, third trimester: Secondary | ICD-10-CM

## 2017-01-18 DIAGNOSIS — O09213 Supervision of pregnancy with history of pre-term labor, third trimester: Secondary | ICD-10-CM

## 2017-01-18 DIAGNOSIS — O09219 Supervision of pregnancy with history of pre-term labor, unspecified trimester: Secondary | ICD-10-CM

## 2017-01-18 DIAGNOSIS — O0990 Supervision of high risk pregnancy, unspecified, unspecified trimester: Secondary | ICD-10-CM

## 2017-01-18 DIAGNOSIS — O99320 Drug use complicating pregnancy, unspecified trimester: Secondary | ICD-10-CM

## 2017-01-18 DIAGNOSIS — Z8774 Personal history of (corrected) congenital malformations of heart and circulatory system: Secondary | ICD-10-CM

## 2017-01-18 DIAGNOSIS — O09899 Supervision of other high risk pregnancies, unspecified trimester: Secondary | ICD-10-CM

## 2017-01-18 DIAGNOSIS — Z9889 Other specified postprocedural states: Principal | ICD-10-CM

## 2017-01-18 DIAGNOSIS — O99323 Drug use complicating pregnancy, third trimester: Secondary | ICD-10-CM

## 2017-01-18 DIAGNOSIS — F1120 Opioid dependence, uncomplicated: Secondary | ICD-10-CM

## 2017-01-18 DIAGNOSIS — O3440 Maternal care for other abnormalities of cervix, unspecified trimester: Secondary | ICD-10-CM

## 2017-01-18 NOTE — Progress Notes (Signed)
Prenatal Visit Note Date: 01/18/2017 Clinic: [facility]  Subjective:  Gloria Weber is a 31 y.o. [id number] at 1251w0d being seen today for ongoing prenatal care.  She is currently monitored for the following issues for this high-risk pregnancy and has Supervision of high risk pregnancy, antepartum; History of LEEP (loop electrosurgical excision procedure) of cervix complicating pregnancy, unspecified trimester; History of preterm delivery, currently pregnant, unspecified trimester; History of congenital heart defect; Abnormal Pap smear of cervix; Adult ADHD; Sciatica; Opioid dependence (HCC); Drug dependence affecting pregnancy, antepartum; Vaginal discharge; and Unwanted fertility on her problem list.  Patient reports no complaints.   Contractions: Not present. Vag. Bleeding: None.  Movement: Present. Denies leaking of fluid.   The following portions of the patient's history were reviewed and updated as appropriate: allergies, current medications, past family history, past medical history, past social history, past surgical history and problem list. Problem list updated.  Objective:   Vitals:   01/18/17 1113  BP: 108/74  Pulse: 70  Weight: 181 lb (82.1 kg)    Fetal Status: Fetal Heart Rate (bpm): 145 Fundal Height: 30 cm Movement: Present     General:  Alert, oriented and cooperative. Patient is in no acute distress.  Skin: Skin is warm and dry. No rash noted.   Cardiovascular: Normal heart rate noted  Respiratory: Normal respiratory effort, no problems with respiration noted  Abdomen: Soft, gravid, appropriate for gestational age. Pain/Pressure: Present     Pelvic:  Cervical exam deferred        Extremities: Normal range of motion.  Edema: Trace  Mental Status: Normal mood and affect. Normal behavior. Normal judgment and thought content.   Urinalysis:      Assessment and Plan:  Pregnancy: G3P1102 at 5851w0d  1. History of congenital heart defect S/p  negative fetal echo-see care everywhere  2. History of LEEP (loop electrosurgical excision procedure) of cervix complicating pregnancy, unspecified trimester No current issues. Normal cx lengths through 2nd trimester  3. Supervision of high risk pregnancy, antepartum Desires BTL. Secondary pregnancy medicaid is pending and card not in yet. Pt told will wait till card is in to sign papers. R/b/a of BTL d/w pt and she desires BTL.  4. History of preterm delivery, currently pregnant, unspecified trimester See above. Continue 17p  5. Uncomplicated opioid dependence (HCC) -Was being followed by Dr. Jeannetta NapElkins of [phone] but she states he'd like our practice to prescribe the pain medications for her sciatica. Database checked: 8/27: Dr Jolayne Pantheronstant Ultram 50mg  # 60 for 8 days 7/5: Dr. Macon LargeAnyanwu Ultram 50mg  #60 for 8 days 6/8: Lance BoschAndrea Blevins NPUltram 50mg  #90 for 30 days 4/26: Dr. Jeannetta NapElkins tylenol 3 #240 for 30 days  -Patient states she takes 2 tabs 4-6h per day.  D/w her re: pain contract and pt is amenable to this. UDS today -s/p NICU tour already. D/w her re: NAAS again today.  -normal 8/17 growth u/s. Rpt already scheduled.   - [id number] 11+Oxyco+Alc+Crt-Bund  Preterm labor symptoms and general obstetric precautions including but not limited to vaginal bleeding, contractions, leaking of fluid and fetal movement were reviewed in detail with the patient. Please refer to After Visit Summary for other counseling recommendations.  Return in about 1 week (around 01/25/2017) for 17p.   [place] BingPickens, Corrinne Benegas, MD    17p today Pain contract? 8/17 normal efw/ac/afi. Rpt 6wks. Nl cl

## 2017-01-18 NOTE — Progress Notes (Signed)
Patient received Makena 250 mg

## 2017-01-22 LAB — DRUG SCREEN 764883 11+OXYCO+ALC+CRT-BUND
AMPHETAMINES, URINE: NEGATIVE ng/mL
BENZODIAZ UR QL: NEGATIVE ng/mL
Barbiturate: NEGATIVE ng/mL
COCAINE (METABOLITE): NEGATIVE ng/mL
Cannabinoid Quant, Ur: NEGATIVE ng/mL
Creatinine: 87.7 mg/dL (ref 20.0–300.0)
ETHANOL: NEGATIVE %
MEPERIDINE: NEGATIVE ng/mL
METHADONE SCREEN, URINE: NEGATIVE ng/mL
OPIATE SCREEN URINE: NEGATIVE ng/mL
OXYCODONE+OXYMORPHONE UR QL SCN: NEGATIVE ng/mL
PHENCYCLIDINE: NEGATIVE ng/mL
Propoxyphene: NEGATIVE ng/mL
pH, Urine: 6.5 (ref 4.5–8.9)

## 2017-01-22 LAB — TRAMADOL GC/MS, URINE
TRAMADOL: POSITIVE — AB
Tramadol gc/ms Conf: >3000 ng/mL

## 2017-01-25 ENCOUNTER — Ambulatory Visit (INDEPENDENT_AMBULATORY_CARE_PROVIDER_SITE_OTHER): Payer: 59 | Admitting: *Deleted

## 2017-01-25 ENCOUNTER — Other Ambulatory Visit: Payer: Self-pay | Admitting: *Deleted

## 2017-01-25 VITALS — BP 115/71 | HR 83

## 2017-01-25 DIAGNOSIS — O09213 Supervision of pregnancy with history of pre-term labor, third trimester: Secondary | ICD-10-CM | POA: Diagnosis not present

## 2017-01-25 DIAGNOSIS — O09899 Supervision of other high risk pregnancies, unspecified trimester: Secondary | ICD-10-CM

## 2017-01-25 DIAGNOSIS — F909 Attention-deficit hyperactivity disorder, unspecified type: Secondary | ICD-10-CM

## 2017-01-25 DIAGNOSIS — O09219 Supervision of pregnancy with history of pre-term labor, unspecified trimester: Principal | ICD-10-CM

## 2017-01-25 NOTE — Telephone Encounter (Signed)
Gloria Weber in for 17p  , request a refill of ritalin- states we refilled last time and her pcp wants us to mange while she is pregnant. Informed her I will forward to Dr. Vergie LivingPickens.

## 2017-01-28 MED ORDER — METHYLPHENIDATE HCL 20 MG PO TABS: 60.0000 mg | ORAL_TABLET | Freq: Every day | ORAL | 0 refills | Status: DC

## 2017-01-29 ENCOUNTER — Encounter: Payer: Self-pay | Admitting: *Deleted

## 2017-01-29 ENCOUNTER — Telehealth: Payer: Self-pay

## 2017-01-29 DIAGNOSIS — F909 Attention-deficit hyperactivity disorder, unspecified type: Secondary | ICD-10-CM

## 2017-01-29 NOTE — Telephone Encounter (Signed)
Pt called the office stating that her sciatica pain is affecting her ability to stand and walk. Pt would like to have a refill on Tramadol. Please return call.

## 2017-01-30 ENCOUNTER — Other Ambulatory Visit: Payer: Self-pay | Admitting: Obstetrics and Gynecology

## 2017-01-30 DIAGNOSIS — O99320 Drug use complicating pregnancy, unspecified trimester: Secondary | ICD-10-CM

## 2017-01-30 DIAGNOSIS — F909 Attention-deficit hyperactivity disorder, unspecified type: Secondary | ICD-10-CM

## 2017-01-30 DIAGNOSIS — F1120 Opioid dependence, uncomplicated: Secondary | ICD-10-CM

## 2017-01-30 MED ORDER — METHYLPHENIDATE HCL 20 MG PO TABS: 60.0000 mg | ORAL_TABLET | Freq: Every day | ORAL | 0 refills | Status: DC

## 2017-01-30 MED ORDER — TRAMADOL HCL 50 MG PO TABS: 50.0000 mg | ORAL_TABLET | ORAL | 0 refills | Status: DC | PRN

## 2017-01-30 NOTE — Progress Notes (Signed)
OB Note  Patient called clinic and states she needs refill on pain meds. [place] Database check and last Rx given was by Dr. Jolayne Pantheronstant on 8/27 ultram 50mg  #60. Patient at her last visit states she takes two tabs every 4-6h prn. I wrote for Ultram 50mg  #192 (to last her 14 days) and gave her a refill on her ritalin. RN to call pt and tell her okay to pick up and pt to leave urine sample for UDS.   Gloria Copaharlie [name], Jr MD Attending [facility] 01/30/2017 Time: [phone]

## 2017-01-30 NOTE — Telephone Encounter (Signed)
Patient called and left message stating she needs a refill on her ritalin and tramadol which she requested Friday. Per chart review, ritalin was prescribed, but rx printed. Spoke with Dr Vergie LivingPickens who will refill ritalin & tramadol. Tramadol will be a 2 week supply that patient can pick up at each two week visit. Called and informed patient of prescriptions waiting at front desk. Patient states she cannot come today but will send her mother Marcelle Smilingatasha for her. Patient had no questions

## 2017-02-01 ENCOUNTER — Ambulatory Visit (INDEPENDENT_AMBULATORY_CARE_PROVIDER_SITE_OTHER): Payer: 59 | Admitting: Obstetrics & Gynecology

## 2017-02-01 ENCOUNTER — Encounter: Payer: Self-pay | Admitting: Obstetrics & Gynecology

## 2017-02-01 VITALS — BP 124/82 | HR 96 | Wt 191.7 lb

## 2017-02-01 DIAGNOSIS — F1120 Opioid dependence, uncomplicated: Secondary | ICD-10-CM

## 2017-02-01 DIAGNOSIS — Z9889 Other specified postprocedural states: Secondary | ICD-10-CM

## 2017-02-01 DIAGNOSIS — O99323 Drug use complicating pregnancy, third trimester: Secondary | ICD-10-CM

## 2017-02-01 DIAGNOSIS — O99320 Drug use complicating pregnancy, unspecified trimester: Secondary | ICD-10-CM

## 2017-02-01 DIAGNOSIS — O09213 Supervision of pregnancy with history of pre-term labor, third trimester: Secondary | ICD-10-CM | POA: Diagnosis not present

## 2017-02-01 DIAGNOSIS — O09219 Supervision of pregnancy with history of pre-term labor, unspecified trimester: Secondary | ICD-10-CM

## 2017-02-01 DIAGNOSIS — O0990 Supervision of high risk pregnancy, unspecified, unspecified trimester: Secondary | ICD-10-CM

## 2017-02-01 DIAGNOSIS — O3440 Maternal care for other abnormalities of cervix, unspecified trimester: Secondary | ICD-10-CM

## 2017-02-01 DIAGNOSIS — F909 Attention-deficit hyperactivity disorder, unspecified type: Secondary | ICD-10-CM

## 2017-02-01 DIAGNOSIS — O0993 Supervision of high risk pregnancy, unspecified, third trimester: Secondary | ICD-10-CM

## 2017-02-01 DIAGNOSIS — Z8774 Personal history of (corrected) congenital malformations of heart and circulatory system: Secondary | ICD-10-CM

## 2017-02-01 DIAGNOSIS — O09899 Supervision of other high risk pregnancies, unspecified trimester: Secondary | ICD-10-CM

## 2017-02-01 DIAGNOSIS — O3443 Maternal care for other abnormalities of cervix, third trimester: Secondary | ICD-10-CM

## 2017-02-01 NOTE — Progress Notes (Signed)
   PRENATAL VISIT NOTE  Subjective:  Gloria Weber is a 31 y.o. G3P1102 at [redacted]w[redacted]d being seen today for ongoing prenatal care.  She is currently monitored for the following issues for this high-risk pregnancy and has Supervision of high risk pregnancy, antepartum; History of LEEP (loop electrosurgical excision procedure) of cervix complicating pregnancy, unspecified trimester; History of preterm delivery, currently pregnant, unspecified trimester; History of congenital heart defect; Abnormal Pap smear of cervix; Adult ADHD; Sciatica; Opioid dependence (HCC); Drug dependence affecting pregnancy, antepartum; Vaginal discharge; and Unwanted fertility on her problem list.   Pt c/o URI sx. Pt reports a dry cough x 3 days. Pt with a h/o tob use. None x 3 months.      Patient reports URI sx- cough and  nasal drainage fatigue. .  Contractions: Irregular. Vag. Bleeding: None.  Movement: Present. Denies leaking of fluid.   The following portions of the patient's history were reviewed and updated as appropriate: allergies, current medications, past family history, past medical history, past social history, past surgical history and problem list. Problem list updated.  Objective:   Vitals:   02/01/17 0911  BP: 124/82  Pulse: 96  Weight: 191 lb 11.2 oz (87 kg)    Fetal Status: Fetal Heart Rate (bpm): 155   Movement: Present     General:  Alert, oriented and cooperative. Patient is in no acute distress.  Skin: Skin is warm and dry. No rash noted.   Cardiovascular: Normal heart rate noted  Respiratory: Normal respiratory effort, no problems with respiration noted  Abdomen: Soft, gravid, appropriate for gestational age.  Pain/Pressure: Present     Pelvic: Cervical exam deferred        Extremities: Normal range of motion.  Edema: Trace  Mental Status:  Normal mood and affect. Normal behavior. Normal judgment and thought content.   Assessment and Plan:  Pregnancy: G3P1102 at [redacted]w[redacted]d  1.  Supervision of high risk pregnancy, antepartum URI sx- suspect viral. Rec sx relief   2. Uncomplicated opioid dependence (HCC)  3. History of preterm delivery, currently pregnant, unspecified trimester  4. History of LEEP (loop electrosurgical excision procedure) of cervix complicating pregnancy, unspecified trimester  5. History of congenital heart defect ECHO WNL  6. Drug dependence affecting pregnancy, antepartum  7. Adult ADHD  Preterm labor symptoms and general obstetric precautions including but not limited to vaginal bleeding, contractions, leaking of fluid and fetal movement were reviewed in detail with the patient. Please refer to After Visit Summary for other counseling recommendations.  Return in about 2 weeks (around 02/15/2017).   Willodean Rosenthal, MD

## 2017-02-01 NOTE — Patient Instructions (Signed)
Third Trimester of Pregnancy The third trimester is from week 28 through week 40 (months 7 through 9). The third trimester is a time when the unborn baby (fetus) is growing rapidly. At the end of the ninth month, the fetus is about 20 inches in length and weighs 6-10 pounds. Body changes during your third trimester Your body will continue to go through many changes during pregnancy. The changes vary from woman to woman. During the third trimester:  Your weight will continue to increase. You can expect to gain 25-35 pounds (11-16 kg) by the end of the pregnancy.  You may begin to get stretch marks on your hips, abdomen, and breasts.  You may urinate more often because the fetus is moving lower into your pelvis and pressing on your bladder.  You may develop or continue to have heartburn. This is caused by increased hormones that slow down muscles in the digestive tract.  You may develop or continue to have constipation because increased hormones slow digestion and cause the muscles that push waste through your intestines to relax.  You may develop hemorrhoids. These are swollen veins (varicose veins) in the rectum that can itch or be painful.  You may develop swollen, bulging veins (varicose veins) in your legs.  You may have increased body aches in the pelvis, back, or thighs. This is due to weight gain and increased hormones that are relaxing your joints.  You may have changes in your hair. These can include thickening of your hair, rapid growth, and changes in texture. Some women also have hair loss during or after pregnancy, or hair that feels dry or thin. Your hair will most likely return to normal after your baby is born.  Your breasts will continue to grow and they will continue to become tender. A yellow fluid (colostrum) may leak from your breasts. This is the first milk you are producing for your baby.  Your belly button may stick out.  You may notice more swelling in your hands,  face, or ankles.  You may have increased tingling or numbness in your hands, arms, and legs. The skin on your belly may also feel numb.  You may feel short of breath because of your expanding uterus.  You may have more problems sleeping. This can be caused by the size of your belly, increased need to urinate, and an increase in your body's metabolism.  You may notice the fetus "dropping," or moving lower in your abdomen (lightening).  You may have increased vaginal discharge.  You may notice your joints feel loose and you may have pain around your pelvic bone.  What to expect at prenatal visits You will have prenatal exams every 2 weeks until week 36. Then you will have weekly prenatal exams. During a routine prenatal visit:  You will be weighed to make sure you and the baby are growing normally.  Your blood pressure will be taken.  Your abdomen will be measured to track your baby's growth.  The fetal heartbeat will be listened to.  Any test results from the previous visit will be discussed.  You may have a cervical check near your due date to see if your cervix has softened or thinned (effaced).  You will be tested for Group B streptococcus. This happens between 35 and 37 weeks.  Your health care provider may ask you:  What your birth plan is.  How you are feeling.  If you are feeling the baby move.  If you have had   any abnormal symptoms, such as leaking fluid, bleeding, severe headaches, or abdominal cramping.  If you are using any tobacco products, including cigarettes, chewing tobacco, and electronic cigarettes.  If you have any questions.  Other tests or screenings that may be performed during your third trimester include:  Blood tests that check for low iron levels (anemia).  Fetal testing to check the health, activity level, and growth of the fetus. Testing is done if you have certain medical conditions or if there are problems during the  pregnancy.  Nonstress test (NST). This test checks the health of your baby to make sure there are no signs of problems, such as the baby not getting enough oxygen. During this test, a belt is placed around your belly. The baby is made to move, and its heart rate is monitored during movement.  What is false labor? False labor is a condition in which you feel small, irregular tightenings of the muscles in the womb (contractions) that usually go away with rest, changing position, or drinking water. These are called Braxton Hicks contractions. Contractions may last for hours, days, or even weeks before true labor sets in. If contractions come at regular intervals, become more frequent, increase in intensity, or become painful, you should see your health care provider. What are the signs of labor?  Abdominal cramps.  Regular contractions that start at 10 minutes apart and become stronger and more frequent with time.  Contractions that start on the top of the uterus and spread down to the lower abdomen and back.  Increased pelvic pressure and dull back pain.  A watery or bloody mucus discharge that comes from the vagina.  Leaking of amniotic fluid. This is also known as your "water breaking." It could be a slow trickle or a gush. Let your health care provider know if it has a color or strange odor. If you have any of these signs, call your health care provider right away, even if it is before your due date. Follow these instructions at home: Medicines  Follow your health care provider's instructions regarding medicine use. Specific medicines may be either safe or unsafe to take during pregnancy.  Take a prenatal vitamin that contains at least 600 micrograms (mcg) of folic acid.  If you develop constipation, try taking a stool softener if your health care provider approves. Eating and drinking  Eat a balanced diet that includes fresh fruits and vegetables, whole grains, good sources of protein  such as meat, eggs, or tofu, and low-fat dairy. Your health care provider will help you determine the amount of weight gain that is right for you.  Avoid raw meat and uncooked cheese. These carry germs that can cause birth defects in the baby.  If you have low calcium intake from food, talk to your health care provider about whether you should take a daily calcium supplement.  Eat four or five small meals rather than three large meals a day.  Limit foods that are high in fat and processed sugars, such as fried and sweet foods.  To prevent constipation: ? Drink enough fluid to keep your urine clear or pale yellow. ? Eat foods that are high in fiber, such as fresh fruits and vegetables, whole grains, and beans. Activity  Exercise only as directed by your health care provider. Most women can continue their usual exercise routine during pregnancy. Try to exercise for 30 minutes at least 5 days a week. Stop exercising if you experience uterine contractions.  Avoid heavy   lifting.  Do not exercise in extreme heat or humidity, or at high altitudes.  Wear low-heel, comfortable shoes.  Practice good posture.  You may continue to have sex unless your health care provider tells you otherwise. Relieving pain and discomfort  Take frequent breaks and rest with your legs elevated if you have leg cramps or low back pain.  Take warm sitz baths to soothe any pain or discomfort caused by hemorrhoids. Use hemorrhoid cream if your health care provider approves.  Wear a good support bra to prevent discomfort from breast tenderness.  If you develop varicose veins: ? Wear support pantyhose or compression stockings as told by your healthcare provider. ? Elevate your feet for 15 minutes, 3-4 times a day. Prenatal care  Write down your questions. Take them to your prenatal visits.  Keep all your prenatal visits as told by your health care provider. This is important. Safety  Wear your seat belt at  all times when driving.  Make a list of emergency phone numbers, including numbers for family, friends, the hospital, and police and fire departments. General instructions  Avoid cat litter boxes and soil used by cats. These carry germs that can cause birth defects in the baby. If you have a cat, ask someone to clean the litter box for you.  Do not travel far distances unless it is absolutely necessary and only with the approval of your health care provider.  Do not use hot tubs, steam rooms, or saunas.  Do not drink alcohol.  Do not use any products that contain nicotine or tobacco, such as cigarettes and e-cigarettes. If you need help quitting, ask your health care provider.  Do not use any medicinal herbs or unprescribed drugs. These chemicals affect the formation and growth of the baby.  Do not douche or use tampons or scented sanitary pads.  Do not cross your legs for long periods of time.  To prepare for the arrival of your baby: ? Take prenatal classes to understand, practice, and ask questions about labor and delivery. ? Make a trial run to the hospital. ? Visit the hospital and tour the maternity area. ? Arrange for maternity or paternity leave through employers. ? Arrange for family and friends to take care of pets while you are in the hospital. ? Purchase a rear-facing car seat and make sure you know how to install it in your car. ? Pack your hospital bag. ? Prepare the baby's nursery. Make sure to remove all pillows and stuffed animals from the baby's crib to prevent suffocation.  Visit your dentist if you have not gone during your pregnancy. Use a soft toothbrush to brush your teeth and be gentle when you floss. Contact a health care provider if:  You are unsure if you are in labor or if your water has broken.  You become dizzy.  You have mild pelvic cramps, pelvic pressure, or nagging pain in your abdominal area.  You have lower back pain.  You have persistent  nausea, vomiting, or diarrhea.  You have an unusual or bad smelling vaginal discharge.  You have pain when you urinate. Get help right away if:  Your water breaks before 37 weeks.  You have regular contractions less than 5 minutes apart before 37 weeks.  You have a fever.  You are leaking fluid from your vagina.  You have spotting or bleeding from your vagina.  You have severe abdominal pain or cramping.  You have rapid weight loss or weight gain.    You have shortness of breath with chest pain.  You notice sudden or extreme swelling of your face, hands, ankles, feet, or legs.  Your baby makes fewer than 10 movements in 2 hours.  You have severe headaches that do not go away when you take medicine.  You have vision changes. Summary  The third trimester is from week 28 through week 40, months 7 through 9. The third trimester is a time when the unborn baby (fetus) is growing rapidly.  During the third trimester, your discomfort may increase as you and your baby continue to gain weight. You may have abdominal, leg, and back pain, sleeping problems, and an increased need to urinate.  During the third trimester your breasts will keep growing and they will continue to become tender. A yellow fluid (colostrum) may leak from your breasts. This is the first milk you are producing for your baby.  False labor is a condition in which you feel small, irregular tightenings of the muscles in the womb (contractions) that eventually go away. These are called Braxton Hicks contractions. Contractions may last for hours, days, or even weeks before true labor sets in.  Signs of labor can include: abdominal cramps; regular contractions that start at 10 minutes apart and become stronger and more frequent with time; watery or bloody mucus discharge that comes from the vagina; increased pelvic pressure and dull back pain; and leaking of amniotic fluid. This information is not intended to replace advice  given to you by your health care provider. Make sure you discuss any questions you have with your health care provider. Document Released: 05/01/2001 Document Revised: 10/13/2015 Document Reviewed: 07/08/2012 Elsevier Interactive Patient Education  2017 [employer]  

## 2017-02-01 NOTE — Progress Notes (Signed)
Contacted Makena, [place], in regards to sending refill on 17p.  Was informed that refill will be sent to office on Tuesday September 18th.

## 2017-02-04 ENCOUNTER — Other Ambulatory Visit (HOSPITAL_COMMUNITY): Payer: Self-pay | Admitting: Certified Nurse Midwife

## 2017-02-05 MED ORDER — CYCLOBENZAPRINE HCL 10 MG PO TABS
10.0000 mg | ORAL_TABLET | Freq: Three times a day (TID) | ORAL | 0 refills | Status: DC | PRN
Start: 2017-02-05 — End: 2017-06-04

## 2017-02-07 ENCOUNTER — Encounter: Payer: Self-pay | Admitting: Obstetrics & Gynecology

## 2017-02-15 ENCOUNTER — Ambulatory Visit (HOSPITAL_COMMUNITY)
Admission: RE | Admit: 2017-02-15 | Discharge: 2017-02-15 | Disposition: A | Discharge status: Home / Self Care | Payer: 59 | Source: Ambulatory Visit | Attending: Obstetrics & Gynecology | Admitting: Obstetrics & Gynecology

## 2017-02-15 ENCOUNTER — Encounter (HOSPITAL_COMMUNITY): Payer: Self-pay

## 2017-02-15 DIAGNOSIS — O99320 Drug use complicating pregnancy, unspecified trimester: Secondary | ICD-10-CM

## 2017-02-15 DIAGNOSIS — M5431 Sciatica, right side: Secondary | ICD-10-CM

## 2017-02-15 DIAGNOSIS — Z3689 Encounter for other specified antenatal screening: Secondary | ICD-10-CM | POA: Insufficient documentation

## 2017-02-15 DIAGNOSIS — Z3A34 34 weeks gestation of pregnancy: Secondary | ICD-10-CM | POA: Insufficient documentation

## 2017-02-15 DIAGNOSIS — F1120 Opioid dependence, uncomplicated: Secondary | ICD-10-CM

## 2017-02-15 DIAGNOSIS — F909 Attention-deficit hyperactivity disorder, unspecified type: Secondary | ICD-10-CM

## 2017-02-18 ENCOUNTER — Other Ambulatory Visit (HOSPITAL_COMMUNITY): Payer: Self-pay | Admitting: *Deleted

## 2017-02-18 ENCOUNTER — Ambulatory Visit (INDEPENDENT_AMBULATORY_CARE_PROVIDER_SITE_OTHER): Payer: 59 | Admitting: Obstetrics & Gynecology

## 2017-02-18 VITALS — BP 120/87 | HR 88 | Wt 197.8 lb

## 2017-02-18 DIAGNOSIS — O99323 Drug use complicating pregnancy, third trimester: Secondary | ICD-10-CM

## 2017-02-18 DIAGNOSIS — O09212 Supervision of pregnancy with history of pre-term labor, second trimester: Secondary | ICD-10-CM | POA: Diagnosis not present

## 2017-02-18 DIAGNOSIS — Z029 Encounter for administrative examinations, unspecified: Secondary | ICD-10-CM

## 2017-02-18 DIAGNOSIS — O09899 Supervision of other high risk pregnancies, unspecified trimester: Secondary | ICD-10-CM

## 2017-02-18 DIAGNOSIS — O09219 Supervision of pregnancy with history of pre-term labor, unspecified trimester: Secondary | ICD-10-CM

## 2017-02-18 DIAGNOSIS — O0990 Supervision of high risk pregnancy, unspecified, unspecified trimester: Secondary | ICD-10-CM

## 2017-02-18 DIAGNOSIS — O0992 Supervision of high risk pregnancy, unspecified, second trimester: Secondary | ICD-10-CM

## 2017-02-18 NOTE — Progress Notes (Signed)
States feels like baby gradually moving less but still feels baby move every 2-3 hours.

## 2017-02-18 NOTE — Patient Instructions (Signed)

## 2017-02-23 ENCOUNTER — Inpatient Hospital Stay (HOSPITAL_COMMUNITY): Payer: 59 | Admitting: Anesthesiology

## 2017-02-23 ENCOUNTER — Inpatient Hospital Stay (HOSPITAL_COMMUNITY)
Admission: AD | Admit: 2017-02-23 | Discharge: 2017-02-26 | DRG: 805 | Disposition: A | Discharge status: Home / Self Care | Payer: 59 | Source: Ambulatory Visit | Attending: Obstetrics & Gynecology | Admitting: Obstetrics & Gynecology

## 2017-02-23 ENCOUNTER — Encounter (HOSPITAL_COMMUNITY): Payer: Self-pay

## 2017-02-23 DIAGNOSIS — O99320 Drug use complicating pregnancy, unspecified trimester: Secondary | ICD-10-CM | POA: Diagnosis present

## 2017-02-23 DIAGNOSIS — F909 Attention-deficit hyperactivity disorder, unspecified type: Secondary | ICD-10-CM | POA: Diagnosis present

## 2017-02-23 DIAGNOSIS — R300 Dysuria: Secondary | ICD-10-CM | POA: Diagnosis present

## 2017-02-23 DIAGNOSIS — Z87891 Personal history of nicotine dependence: Secondary | ICD-10-CM | POA: Diagnosis not present

## 2017-02-23 DIAGNOSIS — O99344 Other mental disorders complicating childbirth: Secondary | ICD-10-CM | POA: Diagnosis present

## 2017-02-23 DIAGNOSIS — O99324 Drug use complicating childbirth: Secondary | ICD-10-CM | POA: Diagnosis present

## 2017-02-23 DIAGNOSIS — F1120 Opioid dependence, uncomplicated: Secondary | ICD-10-CM | POA: Diagnosis present

## 2017-02-23 DIAGNOSIS — O26893 Other specified pregnancy related conditions, third trimester: Secondary | ICD-10-CM | POA: Diagnosis present

## 2017-02-23 DIAGNOSIS — Z3A35 35 weeks gestation of pregnancy: Secondary | ICD-10-CM

## 2017-02-23 DIAGNOSIS — Z6838 Body mass index (BMI) 38.0-38.9, adult: Secondary | ICD-10-CM | POA: Diagnosis not present

## 2017-02-23 LAB — CBC
HEMATOCRIT: 36.1 % (ref 36.0–46.0)
HEMOGLOBIN: 12.3 g/dL (ref 12.0–15.0)
MCH: 30.9 pg (ref 26.0–34.0)
MCHC: 34.1 g/dL (ref 30.0–36.0)
MCV: 90.7 fL (ref 78.0–100.0)
Platelets: 176 10*3/uL (ref 150–400)
RBC: 3.98 MIL/uL (ref 3.87–5.11)
RDW: 13.8 % (ref 11.5–15.5)
WBC: 21.7 10*3/uL — ABNORMAL HIGH (ref 4.0–10.5)

## 2017-02-23 MED ORDER — OXYTOCIN BOLUS FROM INFUSION
500.0000 mL | Freq: Once | INTRAVENOUS | Status: AC
  Administered 2017-02-24: 500 mL via INTRAVENOUS

## 2017-02-23 MED ORDER — HYDROXYZINE HCL 50 MG PO TABS
50.0000 mg | ORAL_TABLET | Freq: Four times a day (QID) | ORAL | Status: DC | PRN
  Filled 2017-02-23: qty 1

## 2017-02-23 MED ORDER — PENICILLIN G POT IN DEXTROSE 60000 UNIT/ML IV SOLN
3.0000 10*6.[IU] | INTRAVENOUS | Status: DC
  Administered 2017-02-24 (×3): 3 10*6.[IU] via INTRAVENOUS
  Filled 2017-02-23 (×4): qty 50

## 2017-02-23 MED ORDER — FENTANYL CITRATE (PF) 100 MCG/2ML IJ SOLN: 50.0000 ug | INTRAMUSCULAR | Status: DC | PRN

## 2017-02-23 MED ORDER — ACETAMINOPHEN 325 MG PO TABS: 650.0000 mg | ORAL_TABLET | ORAL | Status: DC | PRN

## 2017-02-23 MED ORDER — OXYTOCIN 40 UNITS IN LACTATED RINGERS INFUSION - SIMPLE MED
2.5000 [IU]/h | INTRAVENOUS | Status: DC
  Filled 2017-02-23: qty 1000

## 2017-02-23 MED ORDER — OXYCODONE-ACETAMINOPHEN 5-325 MG PO TABS: 2.0000 | ORAL_TABLET | ORAL | Status: DC | PRN

## 2017-02-23 MED ORDER — LACTATED RINGERS IV SOLN: INTRAVENOUS | Status: DC

## 2017-02-23 MED ORDER — PENICILLIN G POTASSIUM 5000000 UNITS IJ SOLR
5.0000 10*6.[IU] | Freq: Once | INTRAVENOUS | Status: AC
  Administered 2017-02-23: 5 10*6.[IU] via INTRAVENOUS
  Filled 2017-02-23: qty 5

## 2017-02-23 MED ORDER — LIDOCAINE HCL (PF) 1 % IJ SOLN
INTRAMUSCULAR | Status: DC | PRN
  Administered 2017-02-23: 4 mL via EPIDURAL
  Administered 2017-02-23: 3 mL via EPIDURAL

## 2017-02-23 MED ORDER — OXYCODONE-ACETAMINOPHEN 5-325 MG PO TABS: 1.0000 | ORAL_TABLET | ORAL | Status: DC | PRN

## 2017-02-23 MED ORDER — SOD CITRATE-CITRIC ACID 500-334 MG/5ML PO SOLN: 30.0000 mL | ORAL | Status: DC | PRN

## 2017-02-23 MED ORDER — LACTATED RINGERS IV SOLN: 500.0000 mL | INTRAVENOUS | Status: DC | PRN

## 2017-02-23 MED ORDER — OXYTOCIN BOLUS FROM INFUSION: 500.0000 mL | Freq: Once | INTRAVENOUS | Status: DC

## 2017-02-23 MED ORDER — LIDOCAINE HCL (PF) 1 % IJ SOLN
30.0000 mL | INTRAMUSCULAR | Status: DC | PRN
  Filled 2017-02-23: qty 30

## 2017-02-23 MED ORDER — LACTATED RINGERS IV SOLN
INTRAVENOUS | Status: DC
  Administered 2017-02-24: 04:00:00 via INTRAVENOUS

## 2017-02-23 MED ORDER — LACTATED RINGERS IV SOLN: 500.0000 mL | Freq: Once | INTRAVENOUS | Status: DC

## 2017-02-23 MED ORDER — ONDANSETRON HCL 4 MG/2ML IJ SOLN: 4.0000 mg | Freq: Four times a day (QID) | INTRAMUSCULAR | Status: DC | PRN

## 2017-02-23 MED ORDER — OXYTOCIN 10 UNIT/ML IJ SOLN: 10.0000 [IU] | Freq: Once | INTRAMUSCULAR | Status: DC

## 2017-02-23 MED ORDER — TRAMADOL HCL 50 MG PO TABS: 50.0000 mg | ORAL_TABLET | ORAL | Status: DC | PRN

## 2017-02-23 MED ORDER — EPHEDRINE 5 MG/ML INJ
10.0000 mg | INTRAVENOUS | Status: DC | PRN
  Filled 2017-02-23: qty 2

## 2017-02-23 MED ORDER — ALBUTEROL SULFATE (2.5 MG/3ML) 0.083% IN NEBU: 3.0000 mL | INHALATION_SOLUTION | Freq: Four times a day (QID) | RESPIRATORY_TRACT | Status: DC | PRN

## 2017-02-23 MED ORDER — OXYTOCIN 40 UNITS IN LACTATED RINGERS INFUSION - SIMPLE MED: 2.5000 [IU]/h | INTRAVENOUS | Status: DC

## 2017-02-23 MED ORDER — CYCLOBENZAPRINE HCL 10 MG PO TABS
10.0000 mg | ORAL_TABLET | Freq: Three times a day (TID) | ORAL | Status: DC | PRN
  Filled 2017-02-23: qty 1

## 2017-02-23 MED ORDER — SODIUM CHLORIDE 0.9% FLUSH: 3.0000 mL | INTRAVENOUS | Status: DC | PRN

## 2017-02-23 MED ORDER — METHYLPHENIDATE HCL 20 MG PO TABS: 60.0000 mg | ORAL_TABLET | Freq: Every day | ORAL | Status: DC

## 2017-02-23 MED ORDER — PHENYLEPHRINE 40 MCG/ML (10ML) SYRINGE FOR IV PUSH (FOR BLOOD PRESSURE SUPPORT)
80.0000 ug | PREFILLED_SYRINGE | INTRAVENOUS | Status: DC | PRN
  Filled 2017-02-23: qty 5

## 2017-02-23 MED ORDER — FENTANYL 2.5 MCG/ML BUPIVACAINE 1/10 % EPIDURAL INFUSION (WH - ANES)
14.0000 mL/h | INTRAMUSCULAR | Status: DC | PRN
  Administered 2017-02-23: 12 mL/h via EPIDURAL
  Administered 2017-02-24: 14 mL/h via EPIDURAL
  Filled 2017-02-23 (×2): qty 100

## 2017-02-23 MED ORDER — LACTATED RINGERS IV BOLUS (SEPSIS)
1000.0000 mL | Freq: Once | INTRAVENOUS | Status: AC
  Administered 2017-02-23: 1000 mL via INTRAVENOUS

## 2017-02-23 MED ORDER — PHENYLEPHRINE 40 MCG/ML (10ML) SYRINGE FOR IV PUSH (FOR BLOOD PRESSURE SUPPORT)
80.0000 ug | PREFILLED_SYRINGE | INTRAVENOUS | Status: DC | PRN
  Filled 2017-02-23: qty 10
  Filled 2017-02-23: qty 5

## 2017-02-23 MED ORDER — SODIUM CHLORIDE 0.9% FLUSH: 3.0000 mL | Freq: Two times a day (BID) | INTRAVENOUS | Status: DC

## 2017-02-23 MED ORDER — DIPHENHYDRAMINE HCL 50 MG/ML IJ SOLN: 12.5000 mg | INTRAMUSCULAR | Status: DC | PRN

## 2017-02-23 MED ORDER — SODIUM CHLORIDE 0.9 % IV SOLN
250.0000 mL | INTRAVENOUS | Status: DC | PRN
Start: 2017-02-23 — End: 2017-02-24

## 2017-02-23 MED ORDER — FLEET ENEMA 7-19 GM/118ML RE ENEM: 1.0000 | ENEMA | Freq: Every day | RECTAL | Status: DC | PRN

## 2017-02-23 MED ORDER — BETAMETHASONE SOD PHOS & ACET 6 (3-3) MG/ML IJ SUSP
12.0000 mg | INTRAMUSCULAR | Status: DC
  Administered 2017-02-23: 12 mg via INTRAMUSCULAR
  Filled 2017-02-23: qty 2

## 2017-02-23 MED ORDER — ONDANSETRON HCL 4 MG/2ML IJ SOLN
4.0000 mg | Freq: Four times a day (QID) | INTRAMUSCULAR | Status: DC | PRN
Start: 2017-02-23 — End: 2017-02-24

## 2017-02-23 NOTE — Progress Notes (Addendum)
G3P2 @ 35+[redacted] wksga. Presents to triage for r/o labor with contractions that are 3 mins apart about 1500 after leaving from a fair. Denies LOF or bleeding. +FM  1855: EFM applied.   1900: Provider at bs assessing. SVE: 4/100/-1  1910: Labs and IV started. Pt assigned to 165 bs sono done by Darlene: OP  1916: pt to birthing to room 165 via wheel chair taken by OB tech.   1927: birthing charge notified. Made aware of OP status.  1957: birthing charge notified of GBS needing to be done. Stated will take care of it.

## 2017-02-23 NOTE — Anesthesia Pain Management Evaluation Note (Signed)
  CRNA Pain Management Visit Note  Patient: Gloria Weber, 31 y.o., female  "Hello I am a member of the anesthesia team at [facility]. We have an anesthesia team available at all times to provide care throughout the hospital, including epidural management and anesthesia for C-section. I don't know your plan for the delivery whether it a natural birth, water birth, IV sedation, nitrous supplementation, doula or epidural, but we want to meet your pain goals."   1.Was your pain managed to your expectations on prior hospitalizations?   Yes   2.What is your expectation for pain management during this hospitalization?     Epidural  3.How can we help you reach that goal? Epidural currently placed  Record the patient's initial score and the patient's pain goal.   Pain: 9  Pain Goal: 5 The [facility] wants you to be able to say your pain was always managed very well.  Makella Buckingham 02/23/2017

## 2017-02-23 NOTE — Anesthesia Preprocedure Evaluation (Signed)
Anesthesia Evaluation  Patient identified by MRN, date of birth, ID band Patient awake    Reviewed: Allergy & Precautions, Patient's Chart, lab work & pertinent test results  Airway Mallampati: II  TM Distance: >3 FB Neck ROM: Full    Dental no notable dental hx. (+) Teeth Intact   Pulmonary former smoker,    Pulmonary exam normal breath sounds clear to auscultation       Cardiovascular negative cardio ROS Normal cardiovascular exam Rhythm:Regular Rate:Normal     Neuro/Psych  Neuromuscular disease    GI/Hepatic   Endo/Other  Obesity   Renal/GU Renal disease     Musculoskeletal Hx/o sciatica   Abdominal (+) + obese,   Peds  (+) Congenital Heart Disease Hematology  (+) anemia ,   Anesthesia Other Findings   Reproductive/Obstetrics                             Anesthesia Physical Anesthesia Plan  ASA: II  Anesthesia Plan: Epidural   Post-op Pain Management:    Induction:   PONV Risk Score and Plan:   Airway Management Planned: Natural Airway  Additional Equipment:   Intra-op Plan:   Post-operative Plan:   Informed Consent: I have reviewed the patients History and Physical, chart, labs and discussed the procedure including the risks, benefits and alternatives for the proposed anesthesia with the patient or authorized representative who has indicated his/her understanding and acceptance.     Plan Discussed with: Anesthesiologist  Anesthesia Plan Comments:         Anesthesia Quick Evaluation

## 2017-02-23 NOTE — Anesthesia Procedure Notes (Signed)
Epidural Patient location during procedure: OB Start time: 02/23/2017 8:29 PM  Staffing Anesthesiologist: Mal Amabile Performed: anesthesiologist   Preanesthetic Checklist Completed: patient identified, site marked, surgical consent, pre-op evaluation, timeout performed, IV checked, risks and benefits discussed and monitors and equipment checked  Epidural Patient position: sitting Prep: site prepped and draped and DuraPrep Patient monitoring: continuous pulse ox and blood pressure Approach: midline Location: L3-L4 Injection technique: LOR air  Needle:  Needle type: Tuohy  Needle gauge: 17 G Needle length: 9 cm and 9 Needle insertion depth: 7 cm Catheter type: closed end flexible Catheter size: 19 Gauge Catheter at skin depth: 12 cm Test dose: negative and Other  Assessment Events: blood not aspirated, injection not painful, no injection resistance, negative IV test and no paresthesia  Additional Notes Patient identified. Risks and benefits discussed including failed block, incomplete  Pain control, post dural puncture headache, nerve damage, paralysis, blood pressure Changes, nausea, vomiting, reactions to medications-both toxic and allergic and post Partum back pain. All questions were answered. Patient expressed understanding and wished to proceed. Sterile technique was used throughout procedure. Epidural site was Dressed with sterile barrier dressing. No paresthesias, signs of intravascular injection Or signs of intrathecal spread were encountered.  Patient was more comfortable after the epidural was dosed. Please see RN's note for documentation of vital signs and FHR which are stable.

## 2017-02-23 NOTE — H&P (Signed)
Obstetric History and Physical  Gloria Weber is a 31 y.o. [id number] with IUP at [redacted]w[redacted]d presenting for admission due to PTL. Prenatal history has been complicated by opoid dependence, and prior preterm delivery(on 17p). Patient states that contractions started around 3p today.  Patient states she has been having  regular, every 3 minutes contractions, minimal vaginal bleeding, intact membranes, with active fetal movement.    Prenatal Course Source of Care: [facility] with onset of care at 17 weeks Dating: By 12wk [place] --->  Estimated Date of Delivery: 03/29/17 Pregnancy complications or risks: Patient Active Problem List   Diagnosis Date Noted  . Preterm labor in third trimester 02/23/2017  . Vaginal discharge 12/20/2016  . Unwanted fertility 12/20/2016  . Adult ADHD 11/22/2016  . Opioid dependence (HCC) 11/22/2016  . Drug dependence affecting pregnancy, antepartum 11/22/2016  . Sciatica   . Abnormal Pap smear of cervix 10/30/2016  . Supervision of high risk pregnancy, antepartum 10/25/2016  . History of LEEP (loop electrosurgical excision procedure) of cervix complicating pregnancy, unspecified trimester 10/25/2016  . History of preterm delivery, currently pregnant, unspecified trimester 10/25/2016  . History of congenital heart defect 10/25/2016   She plans to breastfeed She desires bilateral tubal ligation for postpartum contraception.   Sono:   , CWD, normal anatomy, cephalic presentation, anterior placenta, 2537g, 76% EFW  Prenatal labs and studies: ABO, Rh: A/Positive/-- (06/07 1449) Antibody: Negative (06/07 1449) Rubella: IMMUNE RPR: Non Reactive (08/17 0919)  HBsAg: Negative (06/07 1449)  HIV:   NON-REACTIVE GBS: Unknown 2 hr Glucola  nml Genetic screening normal Anatomy US normal  Prenatal Transfer Tool  Maternal Diabetes: No Genetic Screening: Normal Maternal Ultrasounds/Referrals: Normal Fetal Ultrasounds or other Referrals:  None Maternal Substance Abuse:  Yes:   Type: Other: opiod Significant Maternal Medications:  None Significant Maternal Lab Results: Lab values include: Other: GBS unknown  Past Medical History:  Diagnosis Date  . Abnormal Pap smear   . Anemia   . Anemia, iron deficiency 03/23/2016  . Chronic kidney disease    kidney infection  . CIN III with severe dysplasia   . Pilonidal cyst 2010  . Pyelonephritis 03/21/2016  . Sciatica   . Sepsis (HCC) 03/21/2016  . Vaginal Pap smear, abnormal     Past Surgical History:  Procedure Laterality Date  . CERVICAL CONIZATION W/BX  2010  . DILATION AND CURETTAGE OF UTERUS    . LEEP  2010  . PILONIDAL CYST EXCISION  2010  . TONSILLECTOMY      OB History  Gravida Para Term Preterm AB Living  0 2  SAB TAB Ectopic Multiple Live Births  0 0 0   2    # Outcome Date GA Lbr Len/2nd Weight Sex Delivery Anes PTL Lv  3 Current           2 Preterm 01/22/11 [redacted]w[redacted]d 14:50 / 00:25 1.26 kg (2 lb 12.4 oz) M Vag-Spont EPI  LIV  1 Term 04/09/02 [redacted]w[redacted]d  3.459 kg (7 lb 10 oz) M Vag-Spont EPI N LIV     Birth Comments: PIH      Social History   Social History  . Marital status: Widowed    Spouse name: N/A  . Number of children: N/A  . Years of education: N/A   Social History Main Topics  . Smoking status: Former Smoker    Packs/day: 0.25    Quit date: 10/15/2015  . Smokeless tobacco: Never Used  . Alcohol use No  .  Drug use: No  . Sexual activity: Yes    Birth control/ protection: None   Other Topics Concern  . None   Social History Narrative  . None    Family History  Problem Relation Age of Onset  . Heart disease Father   . Diabetes Maternal Grandmother     Facility-Administered Medications Prior to Admission  Medication Dose Route Frequency Provider Last Rate Last Dose  . hydroxyprogesterone caproate (MAKENA) 250 mg/mL injection 250 mg  250 mg Intramuscular Weekly Reva Bores, MD   250 mg at 02/18/17 1352  . HYDROXYprogesterone Caproate SOAJ 275 mg  275 mg  Subcutaneous Weekly Anyanwu, Ugonna A, MD   275 mg at 11/22/16 0915   Prescriptions Prior to Admission  Medication Sig Dispense Refill Last Dose  . albuterol (PROAIR HFA) 108 (90 Base) MCG/ACT inhaler Inhale 1-2 puffs into the lungs every 6 (six) hours as needed for wheezing or shortness of breath.   Taking  . cyclobenzaprine (FLEXERIL) 10 MG tablet Take 1 tablet (10 mg total) by mouth every 8 (eight) hours as needed for muscle spasms. 30 tablet 0 Taking  . methylphenidate (RITALIN) 20 MG tablet Take 3 tablets (60 mg total) by mouth daily. 180 tablet 0 Taking  . ondansetron (ZOFRAN ODT) 4 MG disintegrating tablet Take 1 tablet (4 mg total) by mouth every 8 (eight) hours as needed for nausea or vomiting. (Patient not taking: Reported on 02/15/2017) 15 tablet 0 Not Taking  . Prenatal Vit-Fe Fumarate-FA (PRENATAL MULTIVITAMIN) TABS tablet Take 1 tablet by mouth daily at 12 noon.   Taking  . traMADol (ULTRAM) 50 MG tablet Take 1 tablet (50 mg total) by mouth every 4 (four) hours as needed. 192 tablet 0 Taking  . Vitamin Mixture (VITAMIN E COMPLETE PO) Take by mouth.   Taking    Allergies  Allergen Reactions  . Nsaids Other (See Comments)    Ibuprofen caused excessive nosebleeding.  Avoids all NSAIDS.  Acetaminophen OK    Review of Systems: Negative except for what is mentioned in HPI.  Physical Exam: Temp 98.5 F (36.9 C) (Oral)   Resp 18   LMP 08/21/2016 (Exact Date) Comment: 2 day cycle  SpO2 98%  CONSTITUTIONAL: Well-developed, well-nourished female in no acute distress.  HENT:  Normocephalic, atraumatic, External right and left ear normal. Oropharynx is clear and moist EYES: Conjunctivae and EOM are normal. Pupils are equal, round, and reactive to light. No scleral icterus.  NECK: Normal range of motion, supple, no masses SKIN: Skin is warm and dry. No rash noted. Not diaphoretic. No erythema. No pallor. NEUROLOGIC: Alert and oriented to person, place, and time. Normal reflexes, muscle  tone coordination. No cranial nerve deficit noted. PSYCHIATRIC: Normal mood and affect. Normal behavior. Normal judgment and thought content. CARDIOVASCULAR: Normal heart rate noted, regular rhythm RESPIRATORY: Effort and breath sounds normal, no problems with respiration noted ABDOMEN: Soft, nontender, nondistended, gravid. MUSCULOSKELETAL: Normal range of motion. No edema and no tenderness. 2+ distal pulses.  Per MAU provider 4/100/-1 Presentation: cephalic FHT:  Baseline rate 125 bpm   Variability moderate  Accelerations present   Decelerations none Contractions: Every 3 mins   Pertinent Labs/Studies:   Results for orders placed or performed during the hospital encounter of 02/23/17 (from the past 24 hour(s))  CBC     Status: Abnormal   Collection Time: 02/23/17  8:00 PM  Result Value Ref Range   WBC 21.7 (H) 4.0 - 10.5 K/uL   RBC 3.98 3.87 -  5.11 MIL/uL   Hemoglobin 12.3 12.0 - 15.0 g/dL   HCT 40.9 81.1 - 91.4 %   MCV 90.7 78.0 - 100.0 fL   MCH 30.9 26.0 - 34.0 pg   MCHC 34.1 30.0 - 36.0 g/dL   RDW 78.2 95.6 - 21.3 %   Platelets 176 150 - 400 K/uL    Assessment : Gloria Weber is a 31 y.o. G3P1102 at [redacted]w[redacted]d being admitted for PTL.  Plan: Labor: Expectant management. Will not augment labor due to being preterm. BMZ x1 given repeat in 24hr if able.  Analgesia as needed. FWB: Reassuring fetal heart tracing.  GBS unknown will start prophylactic antibiotics (PCN).  NICU aware of preterm delivery.  Delivery plan: Hopeful for vaginal delivery   Caryl Ada, DO OB Fellow Faculty Practice, [facility] [place] 02/23/2017, 7:24 PM

## 2017-02-24 ENCOUNTER — Encounter (HOSPITAL_COMMUNITY): Payer: Self-pay | Admitting: *Deleted

## 2017-02-24 DIAGNOSIS — Z3A35 35 weeks gestation of pregnancy: Secondary | ICD-10-CM

## 2017-02-24 LAB — URINALYSIS, ROUTINE W REFLEX MICROSCOPIC
Bilirubin Urine: NEGATIVE
GLUCOSE, UA: NEGATIVE mg/dL
Ketones, ur: 20 mg/dL — AB
Leukocytes, UA: NEGATIVE
NITRITE: NEGATIVE
PH: 5 (ref 5.0–8.0)
Protein, ur: >=300 mg/dL — AB
SPECIFIC GRAVITY, URINE: 1.011 (ref 1.005–1.030)
Squamous Epithelial / LPF: NONE SEEN

## 2017-02-24 LAB — RAPID URINE DRUG SCREEN, HOSP PERFORMED
AMPHETAMINES: NOT DETECTED
BENZODIAZEPINES: NOT DETECTED
Barbiturates: NOT DETECTED
Cocaine: NOT DETECTED
OPIATES: NOT DETECTED
Tetrahydrocannabinol: NOT DETECTED

## 2017-02-24 LAB — RPR: RPR Ser Ql: NONREACTIVE

## 2017-02-24 MED ORDER — MEASLES, MUMPS & RUBELLA VAC ~~LOC~~ INJ
0.5000 mL | INJECTION | Freq: Once | SUBCUTANEOUS | Status: DC
  Filled 2017-02-24: qty 0.5

## 2017-02-24 MED ORDER — BUTALBITAL-APAP-CAFFEINE 50-325-40 MG PO TABS
2.0000 | ORAL_TABLET | ORAL | Status: DC | PRN
Start: 2017-02-24 — End: 2017-02-24
  Administered 2017-02-24: 1 via ORAL
  Filled 2017-02-24: qty 2

## 2017-02-24 MED ORDER — COCONUT OIL OIL: 1.0000 "application " | TOPICAL_OIL | Status: DC | PRN

## 2017-02-24 MED ORDER — ACETAMINOPHEN 325 MG PO TABS
650.0000 mg | ORAL_TABLET | ORAL | Status: DC | PRN
  Administered 2017-02-24: 650 mg via ORAL
  Filled 2017-02-24 (×2): qty 2

## 2017-02-24 MED ORDER — DIPHENHYDRAMINE HCL 25 MG PO CAPS: 25.0000 mg | ORAL_CAPSULE | Freq: Four times a day (QID) | ORAL | Status: DC | PRN

## 2017-02-24 MED ORDER — BENZOCAINE-MENTHOL 20-0.5 % EX AERO
1.0000 "application " | INHALATION_SPRAY | CUTANEOUS | Status: DC | PRN
  Administered 2017-02-24: 1 via TOPICAL
  Filled 2017-02-24: qty 56

## 2017-02-24 MED ORDER — SENNOSIDES-DOCUSATE SODIUM 8.6-50 MG PO TABS
2.0000 | ORAL_TABLET | ORAL | Status: DC
  Administered 2017-02-24 – 2017-02-26 (×2): 2 via ORAL
  Filled 2017-02-24 (×2): qty 2

## 2017-02-24 MED ORDER — IBUPROFEN 600 MG PO TABS: 600.0000 mg | ORAL_TABLET | Freq: Four times a day (QID) | ORAL | Status: DC

## 2017-02-24 MED ORDER — SIMETHICONE 80 MG PO CHEW: 80.0000 mg | CHEWABLE_TABLET | ORAL | Status: DC | PRN

## 2017-02-24 MED ORDER — ONDANSETRON HCL 4 MG/2ML IJ SOLN: 4.0000 mg | INTRAMUSCULAR | Status: DC | PRN

## 2017-02-24 MED ORDER — ZOLPIDEM TARTRATE 5 MG PO TABS: 5.0000 mg | ORAL_TABLET | Freq: Every evening | ORAL | Status: DC | PRN

## 2017-02-24 MED ORDER — SODIUM CHLORIDE 0.9% FLUSH: 3.0000 mL | Freq: Two times a day (BID) | INTRAVENOUS | Status: DC

## 2017-02-24 MED ORDER — ONDANSETRON HCL 4 MG PO TABS: 4.0000 mg | ORAL_TABLET | ORAL | Status: DC | PRN

## 2017-02-24 MED ORDER — TETANUS-DIPHTH-ACELL PERTUSSIS 5-2.5-18.5 LF-MCG/0.5 IM SUSP: 0.5000 mL | Freq: Once | INTRAMUSCULAR | Status: DC

## 2017-02-24 MED ORDER — DIBUCAINE 1 % RE OINT: 1.0000 "application " | TOPICAL_OINTMENT | RECTAL | Status: DC | PRN

## 2017-02-24 MED ORDER — PRENATAL MULTIVITAMIN CH
1.0000 | ORAL_TABLET | Freq: Every day | ORAL | Status: DC
  Administered 2017-02-25 – 2017-02-26 (×2): 1 via ORAL
  Filled 2017-02-24 (×2): qty 1

## 2017-02-24 MED ORDER — SODIUM CHLORIDE 0.9 % IV SOLN: 250.0000 mL | INTRAVENOUS | Status: DC | PRN

## 2017-02-24 MED ORDER — BUTALBITAL-APAP-CAFFEINE 50-325-40 MG PO TABS
1.0000 | ORAL_TABLET | ORAL | Status: DC | PRN
  Administered 2017-02-24 – 2017-02-26 (×6): 1 via ORAL
  Filled 2017-02-24 (×6): qty 1

## 2017-02-24 MED ORDER — WITCH HAZEL-GLYCERIN EX PADS: 1.0000 "application " | MEDICATED_PAD | CUTANEOUS | Status: DC | PRN

## 2017-02-24 MED ORDER — SODIUM CHLORIDE 0.9% FLUSH: 3.0000 mL | INTRAVENOUS | Status: DC | PRN

## 2017-02-24 NOTE — Progress Notes (Signed)
OB Interim Progress Note  S:Uncomfortable, but in NAD  O: BP 111/74   Pulse 86   Temp 98.6 F (37 C) (Oral)   Resp 18   Ht 5' (1.524 m)   Wt 89.4 kg (197 lb)   LMP 08/21/2016 (Exact Date) Comment: 2 day cycle  SpO2 98%   BMI 38.47 kg/m    Dilation: 4.5 Effacement (%): 100 Station: -1 Presentation: Vertex Exam by:: Marvell Fuller  FHT: 120BPM, mod variability, pos accels, no decels Uterine activity: q2-41min  A/P: Patient is feeling contractions with minimal pressure. Will hold off on cervical check at this point.  Continue expectant management Anticipate SVD  Renne Musca, MD 02/24/2017, 7:17 AM PGY-2

## 2017-02-24 NOTE — Progress Notes (Signed)
Patient was told not to eat or drink anything until we figured out if Dr. Despina Hidden was going to do the BTL or not. Patient understood. Epidural intact. IV intact. Patient told not to get up without help.

## 2017-02-24 NOTE — Progress Notes (Signed)
LABOR PROGRESS NOTE  Gloria Weber is a 31 y.o. Z6X0960 at [redacted]w[redacted]d  admitted for PTL  Subjective: Pain controlled with epidural. Pt reports mild discomfort now.   Objective: BP 104/64   Pulse 100   Temp 98.6 F (37 C) (Oral)   Resp 18   Ht 5' (1.524 m)   Wt 197 lb (89.4 kg)   LMP 08/21/2016 (Exact Date) Comment: 2 day cycle  SpO2 98%   BMI 38.47 kg/m  or  Vitals:   02/23/17 2330 02/24/17 0000 02/24/17 0030 02/24/17 0100  BP: 111/65 107/65 108/63 104/64  Pulse: (!) 102 (!) 106 (!) 102 100  Resp:      Temp:  98.6 F (37 C)    TempSrc:  Oral    SpO2:      Weight:      Height:        Last SVE: Dilation: 4.5 Effacement (%): 100 Station: -1 Presentation: Vertex Exam by:: Marvell Fuller FHT: baseline rate 125, moderate varibility, +acel, no decel Toco: ctx q2-4 min  Assessment / Plan: 31 y.o. [id number] at [redacted]w[redacted]d here for PTL  PTL: Expectant management. Ctx has spaced out some. S/p BMZ#1 10/6  At 1959 Fetal Wellbeing:  Cat I Pain Control:  epidural Anticipated MOD:  SVD  Frederik Pear, MD 02/24/2017, 2:47 AM

## 2017-02-24 NOTE — Anesthesia Postprocedure Evaluation (Cosign Needed)
Anesthesia Post Note  Patient: Gloria Weber  Procedure(s) Performed: AN AD HOC LABOR EPIDURAL     Patient location during evaluation: Mother Baby Anesthesia Type: Epidural Level of consciousness: awake and alert, oriented and patient cooperative Pain management: pain level not controlled (Patient identifies cramping and back pain 7/10.  Nurse at bedside with PO medications after encouraging patient to ask for pain medication.) Vital Signs Assessment: post-procedure vital signs reviewed and stable Respiratory status: spontaneous breathing, respiratory function stable and nonlabored ventilation Cardiovascular status: blood pressure returned to baseline and stable Postop Assessment: epidural receding, adequate PO intake, no apparent nausea or vomiting and patient able to bend at knees (Patient states that she has some residual numbness at anterior left thigh, and she could feel her contractions on the right side.) Anesthetic complications: no    Last Vitals:  Vitals:   02/24/17 1100 02/24/17 1151  BP: (!) 103/59 105/68  Pulse: 85 86  Resp: 18 18  Temp: 36.7 C 36.8 C  SpO2: 99%     Last Pain:  Vitals:   02/24/17 1255  TempSrc:   PainSc: 7    Pain Goal: Patients Stated Pain Goal: 3 (02/24/17 1255)               Ballard Russell

## 2017-02-25 ENCOUNTER — Encounter (HOSPITAL_COMMUNITY): Payer: Self-pay

## 2017-02-25 ENCOUNTER — Ambulatory Visit: Payer: 59

## 2017-02-25 LAB — URINALYSIS, ROUTINE W REFLEX MICROSCOPIC
Bilirubin Urine: NEGATIVE
Glucose, UA: NEGATIVE mg/dL
KETONES UR: NEGATIVE mg/dL
Nitrite: NEGATIVE
PROTEIN: NEGATIVE mg/dL
Specific Gravity, Urine: 1.013 (ref 1.005–1.030)
pH: 6 (ref 5.0–8.0)

## 2017-02-25 NOTE — Progress Notes (Signed)
CSW received consult for "maternal drug dependence."  CSW completed chart review and notes that MOB has a pain contract with a doctor for Sciatica.  Her medical record states Vicodin and Tramad/ol use for pain prior to pregnancy and Tramadol only once she became pregnant.  UDS on 01/18/17 and 02/24/17 were negative.  No other concerns noted.  CSW does not feel this hx warrants a CSW consult.  Please call CSW if current concerns arise, by MOB's request, or if MOB scores 10 or greater/yes to question 10 on the Edinburgh Postnatal Depression Scale.   

## 2017-02-25 NOTE — Lactation Note (Addendum)
This note was copied from a baby's chart. Lactation Consultation Note  Patient Name: Gloria Weber Date: 02/25/2017 Reason for consult: Initial assessment;Late-preterm 34-36.6wks  Initial visit at 25 hours of age.  Baby is [redacted]w[redacted]d at 6#1oz.  Mom has 8 months pumping experience with older child who was born at 63 weeks.   Mom is planning to pump and bottle feed so she is already using DEBP.  Mom is also providing formula with bottle to supplement until her milk volume increases.   LC reviewed late preterm parent communication sheet at bedside.  Mom is burping baby after recent bottle feeding. Mom denies concerns at this time.  Mom is using DEBP and not collecting EBM at this time, mom is then doing a little hand expression to offer drops on spoon to baby. [facility] LC resources given and discussed.  Encouraged to feed with early cues on demand mom may need to wake baby for feedings every  2 1/2-3 hours.    Early newborn behavior discussed.  Hand expression reported by mom with colostrum visible.  Mom to call for assist as needed.   Mom was taking Tramadol during pregnancy for back pain and Ritalin to help her concentrate. Tramadol  According to [facility] "Medications & Mother's Milk" is L3 and Ritalin L2.  LC discussed monitoring baby for agitation, poor weight gain, poor sleeping or any other concerns she may have. Mom reports having discussed with baby's Dr. About meds she is taking.     Maternal Data Has patient been taught Hand Expression?: Yes Does the patient have breastfeeding experience prior to this delivery?: Yes  Feeding Feeding Type: Formula Nipple Type: Slow - flow  LATCH Score                   Interventions Interventions: Hand express;DEBP  Lactation Tools Discussed/Used Pump Review: Milk Storage;Setup, frequency, and cleaning Initiated by:: JS IBCLC Date initiated:: 02/25/17   Consult Status Consult Status: Follow-up Date: 02/26/17 Follow-up  type: In-patient    Franz Dell 02/25/2017, 10:46 AM

## 2017-02-25 NOTE — Progress Notes (Signed)
POSTPARTUM PROGRESS NOTE  Post Partum Day 1 Subjective:  Gloria Weber is a 31 y.o. Z6X0960 [redacted]w[redacted]d s/p SVD.  No acute events overnight.  Pt denies problems with ambulating, voiding or po intake.  She denies nausea or vomiting.  Pain is moderately controlled.  She has had flatus. She has not had bowel movement.  Lochia Minimal.   Objective: Blood pressure 112/77, pulse 73, temperature 98.6 F (37 C), temperature source Oral, resp. rate 14, height 5' (1.524 m), weight 89.4 kg (197 lb), last menstrual period 08/21/2016, SpO2 99 %, unknown if currently breastfeeding.  Physical Exam:  General: alert, cooperative and no distress Lochia:normal flow Chest: CTAB Heart: RRR no m/r/g Abdomen: +BS, soft, nontender, no CVA tenderness Uterine Fundus: firm DVT Evaluation: No calf swelling or tenderness Extremities: 1+ edema   Recent Labs  02/23/17 2000  HGB 12.3  HCT 36.1    Assessment/Plan:  Gloria Weber is a 31 y.o. [id number] [redacted]w[redacted]d s/p SVD. Continues with dysuria and complaints of right lower back pain.  Patient is concerned this is pyelonephritis.  She is afebrile, UA inconsistent with infection and has no CVA tenderness. Urine culture pending. Very unlikely to be kidney infection. If she continues with dysuria could consider empirically treating for uncomplicated UTI.  Will continue to monitor.    Plan for discharge tomorrow   LOS: 2 days   Renne Musca, Weber PGY-2 Center for [facility], [facility]  02/25/2017, 9:38 AM   OB FELLOW POSTPARTUM PROGRESS NOTE ATTESTATION  I have seen and examined this patient and agree with above documentation in the resident's note.   Gloria Weber OB Fellow 10:50 PM

## 2017-02-26 LAB — CULTURE, BETA STREP (GROUP B ONLY)

## 2017-02-26 LAB — URINE CULTURE: Culture: NO GROWTH

## 2017-02-26 MED ORDER — ACETAMINOPHEN 325 MG PO TABS: 650.0000 mg | ORAL_TABLET | ORAL | 1 refills | Status: DC | PRN

## 2017-02-26 MED ORDER — TRAMADOL HCL 50 MG PO TABS: 50.0000 mg | ORAL_TABLET | Freq: Four times a day (QID) | ORAL | 0 refills | Status: DC | PRN

## 2017-02-26 NOTE — Discharge Instructions (Signed)
Home Care Instructions for Mom °ACTIVITY °· Gradually return to your regular activities. °· Let yourself rest. Nap while your baby sleeps. °· Avoid lifting anything that is heavier than 10 lb (4.5 kg) until your health care provider says it is okay. °· Avoid activities that take a lot of effort and energy (are strenuous) until approved by your health care provider. Walking at a slow-to-moderate pace is usually safe. °· If you had a cesarean delivery: °? Do not vacuum, climb stairs, or drive a car for 4-6 weeks. °? Have someone help you at home until you feel like you can do your usual activities yourself. °? Do exercises as told by your health care provider, if this applies. ° °VAGINAL BLEEDING °You may continue to bleed for 4-6 weeks after delivery. Over time, the amount of blood usually decreases and the color of the blood usually gets lighter. However, the flow of bright red blood may increase if you have been too active. If you need to use more than one pad in an hour because your pad gets soaked, or if you pass a large clot: °· Lie down. °· Raise your feet. °· Place a cold compress on your lower abdomen. °· Rest. °· Call your health care provider. ° °If you are breastfeeding, your period should return anytime between 8 weeks after delivery and the time that you stop breastfeeding. If you are not breastfeeding, your period should return 6-8 weeks after delivery. °PERINEAL CARE °The perineal area, or perineum, is the part of your body between your thighs. After delivery, this area needs special care. Follow these instructions as told by your health care provider. °· Take warm tub baths for 15-20 minutes. °· Use medicated pads and pain-relieving sprays and creams as told. °· Do not use tampons or douches until vaginal bleeding has stopped. °· Each time you go to the bathroom: °? Use a peri bottle. °? Change your pad. °? Use towelettes in place of toilet paper until your stitches have healed. °· Do Kegel exercises  every day. Kegel exercises help to maintain the muscles that support the vagina, bladder, and bowels. You can do these exercises while you are standing, sitting, or lying down. To do Kegel exercises: °? Tighten the muscles of your abdomen and the muscles that surround your birth canal. °? Hold for a few seconds. °? Relax. °? Repeat until you have done this 5 times in a row. °· To prevent hemorrhoids from developing or getting worse: °? Drink enough fluid to keep your urine clear or pale yellow. °? Avoid straining when having a bowel movement. °? Take over-the-counter medicines and stool softeners as told by your health care provider. ° °BREAST CARE °· Wear a tight-fitting bra. °· Avoid taking over-the-counter pain medicine for breast discomfort. °· Apply ice to the breasts to help with discomfort as needed: °? Put ice in a plastic bag. °? Place a towel between your skin and the bag. °? Leave the ice on for 20 minutes or as told by your health care provider. ° °NUTRITION °· Eat a well-balanced diet. °· Do not try to lose weight quickly by cutting back on calories. °· Take your prenatal vitamins until your postpartum checkup or until your health care provider tells you to stop. ° °POSTPARTUM DEPRESSION °You may find yourself crying for no apparent reason and unable to cope with all of the changes that come with having a newborn. This mood is called postpartum depression. Postpartum depression happens because your hormone   levels change after delivery. If you have postpartum depression, get support from your partner, friends, and family. If the depression does not go away on its own after several weeks, contact your health care provider. BREAST SELF-EXAM Do a breast self-exam each month, at the same time of the month. If you are breastfeeding, check your breasts just after a feeding, when your breasts are less full. If you are breastfeeding and your period has started, check your breasts on day 5, 6, or 7 of your  period. Report any lumps, bumps, or discharge to your health care provider. Know that breasts are normally lumpy if you are breastfeeding. This is temporary, and it is not a health risk. INTIMACY AND SEXUALITY Avoid sexual activity for at least 3-4 weeks after delivery or until the brownish-red vaginal flow is completely gone. If you want to avoid pregnancy, use some form of birth control. You can get pregnant after delivery, even if you have not had your period. SEEK MEDICAL CARE IF:  You feel unable to cope with the changes that a child brings to your life, and these feelings do not go away after several weeks.  You notice a lump, a bump, or discharge on your breast.  SEEK IMMEDIATE MEDICAL CARE IF:  Blood soaks your pad in 1 hour or less.  You have: ? Severe pain or cramping in your lower abdomen. ? A bad-smelling vaginal discharge. ? A fever that is not controlled by medicine. ? A fever, and an area of your breast is red and sore. ? Pain or redness in your calf. ? Sudden, severe chest pain. ? Shortness of breath. ? Painful or bloody urination. ? Problems with your vision.  You vomit for 12 hours or longer.  You develop a severe headache.  You have serious thoughts about hurting yourself, your child, or anyone else.  This information is not intended to replace advice given to you by your health care provider. Make sure you discuss any questions you have with your health care provider. Document Released: 05/04/2000 Document Revised: 10/13/2015 Document Reviewed: 11/08/2014 Elsevier Interactive Patient Education  2017 [employer]  Vaginal Delivery, Care After Refer to this sheet in the next few weeks. These instructions provide you with information about caring for yourself after vaginal delivery. Your health care provider may also give you more specific instructions. Your treatment has been planned according to current medical practices, but problems sometimes occur. Call  your health care provider if you have any problems or questions. What can I expect after the procedure? After vaginal delivery, it is common to have:  Some bleeding from your vagina.  Soreness in your abdomen, your vagina, and the area of skin between your vaginal opening and your anus (perineum).  Pelvic cramps.  Fatigue.  Follow these instructions at home: Medicines  Take over-the-counter and prescription medicines only as told by your health care provider.  If you were prescribed an antibiotic medicine, take it as told by your health care provider. Do not stop taking the antibiotic until it is finished. Driving   Do not drive or operate heavy machinery while taking prescription pain medicine.  Do not drive for 24 hours if you received a sedative. Lifestyle  Do not drink alcohol. This is especially important if you are breastfeeding or taking medicine to relieve pain.  Do not use tobacco products, including cigarettes, chewing tobacco, or e-cigarettes. If you need help quitting, ask your health care provider. Eating and drinking  Drink at least  8 eight-ounce glasses of water every day unless you are told not to by your health care provider. If you choose to breastfeed your baby, you may need to drink more water than this.  Eat high-fiber foods every day. These foods may help prevent or relieve constipation. High-fiber foods include: ? Whole grain cereals and breads. ? Brown rice. ? Beans. ? Fresh fruits and vegetables. Activity  Return to your normal activities as told by your health care provider. Ask your health care provider what activities are safe for you.  Rest as much as possible. Try to rest or take a nap when your baby is sleeping.  Do not lift anything that is heavier than your baby or 10 lb (4.5 kg) until your health care provider says that it is safe.  Talk with your health care provider about when you can engage in sexual activity. This may depend on  your: ? Risk of infection. ? Rate of healing. ? Comfort and desire to engage in sexual activity. Vaginal Care  If you have an episiotomy or a vaginal tear, check the area every day for signs of infection. Check for: ? More redness, swelling, or pain. ? More fluid or blood. ? Warmth. ? Pus or a bad smell.  Do not use tampons or douches until your health care provider says this is safe.  Watch for any blood clots that may pass from your vagina. These may look like clumps of dark red, brown, or black discharge. General instructions  Keep your perineum clean and dry as told by your health care provider.  Wear loose, comfortable clothing.  Wipe from front to back when you use the toilet.  Ask your health care provider if you can shower or take a bath. If you had an episiotomy or a perineal tear during labor and delivery, your health care provider may tell you not to take baths for a certain length of time.  Wear a bra that supports your breasts and fits you well.  If possible, have someone help you with household activities and help care for your baby for at least a few days after you leave the hospital.  Keep all follow-up visits for you and your baby as told by your health care provider. This is important. Contact a health care provider if:  You have: ? Vaginal discharge that has a bad smell. ? Difficulty urinating. ? Pain when urinating. ? A sudden increase or decrease in the frequency of your bowel movements. ? More redness, swelling, or pain around your episiotomy or vaginal tear. ? More fluid or blood coming from your episiotomy or vaginal tear. ? Pus or a bad smell coming from your episiotomy or vaginal tear. ? A fever. ? A rash. ? Little or no interest in activities you used to enjoy. ? Questions about caring for yourself or your baby.  Your episiotomy or vaginal tear feels warm to the touch.  Your episiotomy or vaginal tear is separating or does not appear to be  healing.  Your breasts are painful, hard, or turn red.  You feel unusually sad or worried.  You feel nauseous or you vomit.  You pass large blood clots from your vagina. If you pass a blood clot from your vagina, save it to show to your health care provider. Do not flush blood clots down the toilet without having your health care provider look at them.  You urinate more than usual.  You are dizzy or light-headed.  You have  not breastfed at all and you have not had a menstrual period for 12 weeks after delivery.  You have stopped breastfeeding and you have not had a menstrual period for 12 weeks after you stopped breastfeeding. Get help right away if:  You have: ? Pain that does not go away or does not get better with medicine. ? Chest pain. ? Difficulty breathing. ? Blurred vision or spots in your vision. ? Thoughts about hurting yourself or your baby.  You develop pain in your abdomen or in one of your legs.  You develop a severe headache.  You faint.  You bleed from your vagina so much that you fill two sanitary pads in one hour. This information is not intended to replace advice given to you by your health care provider. Make sure you discuss any questions you have with your health care provider. Document Released: 05/04/2000 Document Revised: 10/19/2015 Document Reviewed: 05/22/2015 Elsevier Interactive Patient Education  2017 [employer].

## 2017-02-26 NOTE — Discharge Summary (Signed)
OB Discharge Summary     Patient Name: Gloria Weber DOB: [birth year] MRN: [id number]  Date of admission: 02/23/2017 Delivering MD: Wyvonnia Dusky D   Date of discharge: 02/26/2017  Admitting diagnosis: 35 WKS, CTXS Intrauterine pregnancy: [redacted]w[redacted]d     Secondary diagnosis:  Principal Problem:   SVD (spontaneous vaginal delivery) Active Problems:   Opioid dependence (HCC)   Drug dependence affecting pregnancy, antepartum   Preterm labor in third trimester  Additional problems: History of LEEP, History of congenital heart defect, unwanted fertility     Discharge diagnosis: Preterm Pregnancy Delivered                                                                                                Post partum procedures:none, will get BTL 30 days from now. Will recieve Depo shot outpatient  Augmentation: none  Complications: None  Hospital course:  Onset of Labor With Vaginal Delivery     31 y.o. yo [id number] at [redacted]w[redacted]d was admitted in Active Labor on 02/23/2017. Patient had an uncomplicated labor course as follows:  Membrane Rupture Time/Date: 8:53 AM ,02/24/2017   Intrapartum Procedures: Episiotomy: None [1]                                         Lacerations:  None [1]  Patient had a delivery of a Viable infant. 02/24/2017  Information for the patient's newborn:  Justise, Ehmann [[id number]]  Delivery Method: Vaginal, Spontaneous Delivery (Filed from Delivery Summary)    Pateint had an uncomplicated postpartum course.  She is ambulating, tolerating a regular diet, passing flatus, and urinating well. Patient is discharged home in stable condition on 02/26/17.   Physical exam  Vitals:   02/24/17 1900 02/25/17 0013 02/25/17 0500 02/25/17 1948  BP: 124/75 103/63 112/77 113/77  Pulse: 98 83 73 80  Resp: Temp: 98.3 F (36.8 C) 98.4 F (36.9 C) 98.6 F (37 C) 98.1 F (36.7 C)  TempSrc: Oral Oral Oral Oral  SpO2:      Weight:      Height:       General:  alert, cooperative and no distress Lochia: appropriate Uterine Fundus: firm Incision: N/A DVT Evaluation: No evidence of DVT seen on physical exam. No cords or calf tenderness. Labs: Lab Results  Component Value Date   WBC 21.7 (H) 02/23/2017   HGB 12.3 02/23/2017   HCT 36.1 02/23/2017   MCV 90.7 02/23/2017   PLT 176 02/23/2017   CMP Latest Ref Rng & Units 10/19/2016  Glucose 65 - 99 mg/dL 85  BUN 6 - 20 mg/dL 6  Creatinine 8.41 - 3.24 mg/dL 4.01(U)  Sodium 272 - 536 mmol/L 138  Potassium 3.5 - 5.1 mmol/L 3.5  Chloride 101 - 111 mmol/L 107  CO2 22 - 32 mmol/L 22  Calcium 8.9 - 10.3 mg/dL 6.4(Q)  Total Protein 6.5 - 8.1 g/dL 7.1  Total Bilirubin 0.3 - 1.2 mg/dL 0.4  Alkaline Phos 38 - 126 U/L  52  AST 15 - 41 U/L 21  ALT 14 - 54 U/L 16    Discharge instruction: per After Visit Summary and "Baby and Me Booklet".  After visit meds:  Allergies as of 02/26/2017      Reactions   Nsaids Other (See Comments)   Ibuprofen caused excessive nosebleeding.  Avoids all NSAIDS.  Acetaminophen OK      Medication List    STOP taking these medications   ondansetron 4 MG disintegrating tablet Commonly known as:  ZOFRAN ODT   prenatal multivitamin Tabs tablet   VITAMIN E COMPLETE PO     TAKE these medications   acetaminophen 325 MG tablet Commonly known as:  TYLENOL Take 2 tablets (650 mg total) by mouth every 4 (four) hours as needed (for pain scale < 4).   calcium carbonate 500 MG chewable tablet Commonly known as:  TUMS - dosed in mg elemental calcium Chew 1,000 mg by mouth daily as needed for indigestion or heartburn.   cyclobenzaprine 10 MG tablet Commonly known as:  FLEXERIL Take 1 tablet (10 mg total) by mouth every 8 (eight) hours as needed for muscle spasms.   methylphenidate 20 MG tablet Commonly known as:  RITALIN Take 3 tablets (60 mg total) by mouth daily.   PROAIR HFA 108 (90 Base) MCG/ACT inhaler Generic drug:  albuterol Inhale 1-2 puffs into the lungs  every 6 (six) hours as needed for wheezing or shortness of breath.   traMADol 50 MG tablet Commonly known as:  ULTRAM Take 1 tablet (50 mg total) by mouth every 4 (four) hours as needed. What changed:  how much to take  reasons to take this            Discharge Care Instructions        Start     Ordered   02/26/17 0000  Change dressing (specify)    Comments:  Dressing change: You may shower with the dressing on. Please leave in place for 5-7 days. It will start to come off on its on. If it is still in place after 7 days you may remove all dressing and strips.   02/26/17 0848      Diet: routine diet  Activity: Advance as tolerated. Pelvic rest for 6 weeks.   Outpatient follow up:6 weeks Follow up Appt:Future Appointments Date Time Provider Department Center  03/18/2017 3:20 PM Reva Bores, MD [facility]   Follow up Visit:No Follow-up on file.  Postpartum contraception: Interval BTL; will get Depo to bridge  Newborn Data: Live born female  Birth Weight: 6 lb 4.2 oz (2841 g) APGAR: 9, 9  Newborn Delivery   Birth date/time:  02/24/2017 09:18:00 Delivery type:  Vaginal, Spontaneous Delivery      Baby Feeding: Breast Disposition:home with mother   02/26/2017 Arlyce Harman, DO  OB FELLOW DISCHARGE ATTESTATION  I have seen and examined this patient and agree with above documentation in the resident's note.   Frederik Pear, MD OB Fellow 9:18 AM

## 2017-02-27 ENCOUNTER — Ambulatory Visit: Payer: Self-pay

## 2017-02-27 NOTE — Lactation Note (Signed)
This note was copied from a baby's chart. Lactation Consultation Note  Patient Name: Gloria Weber Today's Date: 02/27/2017 Reason for consult: Follow-up assessment Baby at 76 hr of life. Mom was requesting lactation with help pumping. Mom lost her DEBP tubing and is reporting "very hard sore breast". Moved mom from a #30 flange to #24 flange and gave her another DEBP kit. Assisted mom with hands on pumping. Mom was able to get 20 ml per breast. Discussed pumping frequency, baby belly size, breast changes, engorgement, mastitis, and nipple care. Mom is interested in latching baby "when she is ready". Encouraged her to call for lactation help as needed. Parents are aware of lactation services and support group. Mom requested a WIC loaner. Paperwork was given to Dad because mom was out of the room and referral faxed to the [place] [facility]IC office.    Maternal Data    Feeding Feeding Type: Formula Nipple Type: Regular Length of feed: 20 min  LATCH Score                   Interventions Interventions: DEBP;Hand pump  Lactation Tools Discussed/Used Pump Review: Other (comment) (pump settings)   Consult Status Consult Status: Follow-up Date: 02/28/17 Follow-up type: In-patient    Elizabeth E Simmons 02/27/2017, 1:38 PM    

## 2017-02-27 NOTE — Lactation Note (Signed)
This note was copied from a baby's chart. Lactation Consultation Note  Patient Name: Gloria Weber HQION'G Date: 02/27/2017 Reason for consult: Follow-up assessment  LC gave mom [facility] loaner pump and collected money and paperwork.  Mom has supplies for pump and denies concerns at this time.  Mom aware of o/p services as needed.     Maternal Data    Feeding Feeding Type: Formula Nipple Type: Regular Length of feed: 20 min  LATCH Score                   Interventions Interventions: DEBP;Hand pump  Lactation Tools Discussed/Used Pump Review: Other (comment) (pump settings)   Consult Status Consult Status: Follow-up Date: 02/28/17 Follow-up type: In-patient    Franz Dell 02/27/2017, 3:26 PM

## 2017-02-28 ENCOUNTER — Ambulatory Visit (INDEPENDENT_AMBULATORY_CARE_PROVIDER_SITE_OTHER): Payer: 59

## 2017-02-28 VITALS — BP 120/83 | HR 84

## 2017-02-28 DIAGNOSIS — Z3042 Encounter for surveillance of injectable contraceptive: Secondary | ICD-10-CM

## 2017-02-28 MED ORDER — MEDROXYPROGESTERONE ACETATE 150 MG/ML IM SUSP
150.0000 mg | Freq: Once | INTRAMUSCULAR | Status: AC
  Administered 2017-02-28: 150 mg via INTRAMUSCULAR

## 2017-02-28 NOTE — Progress Notes (Signed)
Pt here for Depo Provera 150 mg post delivery two days ago for [facility] until she is scheduled for her BTL.  Pt did not have any questions in regards to Depo Provera.

## 2017-02-28 NOTE — Addendum Note (Signed)
Addended by: Faythe Casa on: 02/28/2017 10:49 AM   Modules accepted: Orders, Level of Service

## 2017-03-04 ENCOUNTER — Encounter: Payer: 59 | Admitting: Obstetrics and Gynecology

## 2017-03-12 NOTE — Progress Notes (Signed)
FMLA forms completed 03/12/17 and charged. 

## 2017-03-15 ENCOUNTER — Ambulatory Visit (HOSPITAL_COMMUNITY): Payer: 59

## 2017-03-15 ENCOUNTER — Encounter (HOSPITAL_COMMUNITY): Payer: Self-pay

## 2017-03-18 ENCOUNTER — Ambulatory Visit (INDEPENDENT_AMBULATORY_CARE_PROVIDER_SITE_OTHER): Payer: 59 | Admitting: Family Medicine

## 2017-03-18 ENCOUNTER — Encounter: Payer: Self-pay | Admitting: Family Medicine

## 2017-03-18 VITALS — BP 132/86 | HR 92 | Wt 181.0 lb

## 2017-03-18 DIAGNOSIS — Z3009 Encounter for other general counseling and advice on contraception: Secondary | ICD-10-CM

## 2017-03-18 NOTE — Progress Notes (Signed)
Subjective:     Gloria Weber is a 31 y.o. female who presents for a postpartum visit. She is 3 weeks postpartum following a spontaneous vaginal delivery. I have fully reviewed the prenatal and intrapartum course. The delivery was at 35 gestational weeks. Outcome: spontaneous vaginal delivery. Anesthesia: epidural. Postpartum course has been normal. Baby's course has been ok since dc from NICU. Baby is feeding by both breast and bottle - Similac Neosure. Bleeding staining only. Bowel function is normal. Bladder function is normal. Patient is not sexually active. Contraception method is Depo-Provera injections. Postpartum depression screening: negative.  The following portions of the patient's history were reviewed and updated as appropriate: allergies, current medications, past family history, past medical history, past social history, past surgical history and problem list.  Review of Systems Pertinent items noted in HPI and remainder of comprehensive ROS otherwise negative.   Objective:    There were no vitals taken for this visit.  General:  alert, cooperative and appears stated age  Lungs: normal effort  Heart:  regular rate and rhythm  Abdomen: soft, non-tender; bowel sounds normal; no masses,  no organomegaly        Assessment:    Normal postpartum exam. Pap smear not done at today's visit.   Plan:    1. Contraception: tubal ligation Risks include but are not limited to bleeding, infection, injury to surrounding structures, including bowel, bladder and ureters, blood clots, and death.  Likelihood of success is high. Patient counseled, r.e. Risks benefits of BTL, including permanency of procedure, risk of failure(1:100), increased risk of ectopic.  Patient verbalized understanding and desires to proceed  2. Pap due in 10/2017 3. Follow up in: 3 months for pp check or as needed.

## 2017-03-18 NOTE — Patient Instructions (Signed)
Laparoscopic Tubal Ligation Laparoscopic tubal ligation is a procedure to close the fallopian tubes. This is done so that you cannot get pregnant. When the fallopian tubes are closed, the eggs that your ovaries release cannot enter the uterus, and sperm cannot reach the released eggs. A laparoscopic tubal ligation is sometimes called "getting your tubes tied." You should not have this procedure if you want to get pregnant someday or if you are unsure about having more children. Tell a health care provider about:  Any allergies you have.  All medicines you are taking, including vitamins, herbs, eye drops, creams, and over-the-counter medicines.  Any problems you or family members have had with anesthetic medicines.  Any blood disorders you have.  Any surgeries you have had.  Any medical conditions you have.  Whether you are pregnant or may be pregnant.  Any past pregnancies. What are the risks? Generally, this is a safe procedure. However, problems may occur, including:  Infection.  Bleeding.  Injury to surrounding organs.  Side effects from anesthetics.  Failure of the procedure.  This procedure can increase your risk of a kind of pregnancy in which a fertilized egg attaches to the outside of the uterus (ectopic pregnancy). What happens before the procedure?  Ask your health care provider about: ? Changing or stopping your regular medicines. This is especially important if you are taking diabetes medicines or blood thinners. ? Taking medicines such as aspirin and ibuprofen. These medicines can thin your blood. Do not take these medicines before your procedure if your health care provider instructs you not to.  Follow instructions from your health care provider about eating and drinking restrictions.  Plan to have someone take you home after the procedure.  If you go home right after the procedure, plan to have someone with you for 24 hours. What happens during the  procedure?  You will be given one or more of the following: ? A medicine to help you relax (sedative). ? A medicine to numb the area (local anesthetic). ? A medicine to make you fall asleep (general anesthetic). ? A medicine that is injected into an area of your body to numb everything below the injection site (regional anesthetic).  An IV tube will be inserted into one of your veins. It will be used to give you medicines and fluids during the procedure.  Your bladder may be emptied with a small tube (catheter).  If you have been given a general anesthetic, a tube will be put down your throat to help you breathe.  Two small cuts (incisions) will be made in your lower abdomen and near your belly button.  Your abdomen will be inflated with a gas. This will let the surgeon see better and will give the surgeon room to work.  A thin, lighted tube (laparoscope) with a camera attached will be inserted into your abdomen through one of the incisions. Small instruments will be inserted through the other incision.  The fallopian tubes will be tied off, burned (cauterized), or blocked with a clip, ring, or clamp. A small portion in the center of each fallopian tube may be removed.  The gas will be released from the abdomen.  The incisions will be closed with stitches (sutures).  A bandage (dressing) will be placed over the incisions. The procedure may vary among health care providers and hospitals. What happens after the procedure?  Your blood pressure, heart rate, breathing rate, and blood oxygen level will be monitored often until the medicines you   were given have worn off.  You will be given medicine to help with pain, nausea, and vomiting as needed. This information is not intended to replace advice given to you by your health care provider. Make sure you discuss any questions you have with your health care provider. Document Released: 08/13/2000 Document Revised: 10/13/2015 Document  Reviewed: 04/17/2015 Elsevier Interactive Patient Education  2018 [employer]  

## 2017-03-29 ENCOUNTER — Encounter (HOSPITAL_COMMUNITY): Payer: Self-pay

## 2017-03-29 ENCOUNTER — Encounter: Payer: Self-pay | Admitting: Family Medicine

## 2017-06-04 ENCOUNTER — Encounter (HOSPITAL_BASED_OUTPATIENT_CLINIC_OR_DEPARTMENT_OTHER): Payer: Self-pay | Admitting: *Deleted

## 2017-06-04 ENCOUNTER — Other Ambulatory Visit: Payer: Self-pay

## 2017-06-04 NOTE — Pre-Procedure Instructions (Signed)
To come for CBC, urine preg.

## 2017-06-06 DIAGNOSIS — Z302 Encounter for sterilization: Secondary | ICD-10-CM

## 2017-06-06 NOTE — H&P (Signed)
  Gloria Weber is an 32 y.o. [phone] female.   Chief Complaint: Undesired fertility HPI: Patient desires permanent sterility.  Past Medical History:  Diagnosis Date  . ADD (attention deficit disorder)   . Sciatica     Past Surgical History:  Procedure Laterality Date  . CERVICAL CONIZATION W/BX  01/28/2009  . DILATION AND CURETTAGE OF UTERUS  01/28/2009  . LEEP  2010  . PILONIDAL CYST EXCISION  08/27/2008  . TONSILLECTOMY  01/11/2006    Family History  Problem Relation Age of Onset  . Heart disease Father   . Diabetes Maternal Grandmother    Social History:  reports that she quit smoking about 12 months ago. she has never used smokeless tobacco. She reports that she drinks alcohol. She reports that she does not use drugs.  Allergies:  Allergies  Allergen Reactions  . Nsaids Other (See Comments)    EXCESSIVE BLEEDING  . Adhesive [Tape] Rash    PLASTIC TAPE    No medications prior to admission.    A comprehensive review of systems was negative.  Height 5\' 2"  (1.575 m), weight 180 lb (81.6 kg), last menstrual period 05/21/2017, not currently breastfeeding. General appearance: alert and cooperative Head: Normocephalic, without obvious abnormality, atraumatic Neck: supple, symmetrical, trachea midline Lungs: normal effort Heart: regular rate and rhythm Abdomen: soft, non-tender; bowel sounds normal; no masses,  no organomegaly Extremities: Homans sign is negative, no sign of DVT Skin: Skin color, texture, turgor normal. No rashes or lesions Neurologic: Grossly normal  Assessment/Plan Principal Problem:   Encounter for sterilization  For laparoscopic BTL  Risks include but are not limited to bleeding, infection, injury to surrounding structures, including bowel, bladder and ureters, blood clots, and death.  Likelihood of success is high. Patient counseled, r.e. Risks benefits of BTL, including permanency of procedure, risk of failure(1:100), increased risk of  ectopic.  Patient verbalized understanding and desires to proceed     Gloria Weber 06/06/2017, 11:22 AM

## 2017-06-10 ENCOUNTER — Encounter (HOSPITAL_BASED_OUTPATIENT_CLINIC_OR_DEPARTMENT_OTHER)
Admission: RE | Admit: 2017-06-10 | Discharge: 2017-06-10 | Disposition: A | Discharge status: Home / Self Care | Payer: 59 | Source: Ambulatory Visit | Attending: Family Medicine | Admitting: Family Medicine

## 2017-06-10 DIAGNOSIS — Z6832 Body mass index (BMI) 32.0-32.9, adult: Secondary | ICD-10-CM | POA: Diagnosis not present

## 2017-06-10 DIAGNOSIS — Z888 Allergy status to other drugs, medicaments and biological substances status: Secondary | ICD-10-CM | POA: Diagnosis not present

## 2017-06-10 DIAGNOSIS — Z302 Encounter for sterilization: Secondary | ICD-10-CM | POA: Diagnosis not present

## 2017-06-10 DIAGNOSIS — Z87891 Personal history of nicotine dependence: Secondary | ICD-10-CM | POA: Diagnosis not present

## 2017-06-10 DIAGNOSIS — E669 Obesity, unspecified: Secondary | ICD-10-CM | POA: Diagnosis not present

## 2017-06-10 LAB — CBC
HEMATOCRIT: 39.6 % (ref 36.0–46.0)
HEMOGLOBIN: 13.1 g/dL (ref 12.0–15.0)
MCH: 30.1 pg (ref 26.0–34.0)
MCHC: 33.1 g/dL (ref 30.0–36.0)
MCV: 91 fL (ref 78.0–100.0)
Platelets: 307 10*3/uL (ref 150–400)
RBC: 4.35 MIL/uL (ref 3.87–5.11)
RDW: 13.2 % (ref 11.5–15.5)
WBC: 11.7 10*3/uL — ABNORMAL HIGH (ref 4.0–10.5)

## 2017-06-10 LAB — POCT PREGNANCY, URINE: PREG TEST UR: NEGATIVE

## 2017-06-12 ENCOUNTER — Ambulatory Visit (HOSPITAL_BASED_OUTPATIENT_CLINIC_OR_DEPARTMENT_OTHER): Payer: 59 | Admitting: Anesthesiology

## 2017-06-12 ENCOUNTER — Ambulatory Visit (HOSPITAL_BASED_OUTPATIENT_CLINIC_OR_DEPARTMENT_OTHER)
Admission: RE | Admit: 2017-06-12 | Discharge: 2017-06-12 | Disposition: A | Discharge status: Home / Self Care | Payer: 59 | Source: Ambulatory Visit | Attending: Family Medicine | Admitting: Family Medicine

## 2017-06-12 ENCOUNTER — Encounter (HOSPITAL_BASED_OUTPATIENT_CLINIC_OR_DEPARTMENT_OTHER)
Admission: RE | Disposition: A | Discharge status: Home / Self Care | Payer: Self-pay | Source: Ambulatory Visit | Attending: Family Medicine

## 2017-06-12 ENCOUNTER — Other Ambulatory Visit: Payer: Self-pay

## 2017-06-12 ENCOUNTER — Encounter (HOSPITAL_BASED_OUTPATIENT_CLINIC_OR_DEPARTMENT_OTHER): Payer: Self-pay | Admitting: Anesthesiology

## 2017-06-12 DIAGNOSIS — E669 Obesity, unspecified: Secondary | ICD-10-CM | POA: Insufficient documentation

## 2017-06-12 DIAGNOSIS — Z6832 Body mass index (BMI) 32.0-32.9, adult: Secondary | ICD-10-CM | POA: Insufficient documentation

## 2017-06-12 DIAGNOSIS — Z302 Encounter for sterilization: Secondary | ICD-10-CM

## 2017-06-12 DIAGNOSIS — Z888 Allergy status to other drugs, medicaments and biological substances status: Secondary | ICD-10-CM | POA: Insufficient documentation

## 2017-06-12 DIAGNOSIS — Z87891 Personal history of nicotine dependence: Secondary | ICD-10-CM | POA: Insufficient documentation

## 2017-06-12 HISTORY — PX: LAPAROSCOPIC TUBAL LIGATION: SHX1937

## 2017-06-12 HISTORY — DX: Other specified behavioral and emotional disorders with onset usually occurring in childhood and adolescence: F98.8

## 2017-06-12 SURGERY — LIGATION, FALLOPIAN TUBE, LAPAROSCOPIC
Anesthesia: General | Site: Abdomen | Laterality: Bilateral

## 2017-06-12 MED ORDER — DEXAMETHASONE SODIUM PHOSPHATE 10 MG/ML IJ SOLN
INTRAMUSCULAR | Status: AC
  Filled 2017-06-12: qty 1

## 2017-06-12 MED ORDER — BUPIVACAINE HCL (PF) 0.25 % IJ SOLN
INTRAMUSCULAR | Status: AC
Start: 2017-06-12 — End: 2017-06-12
  Filled 2017-06-12: qty 30

## 2017-06-12 MED ORDER — HYDROMORPHONE HCL 1 MG/ML IJ SOLN
0.2500 mg | INTRAMUSCULAR | Status: DC | PRN
  Administered 2017-06-12 (×2): 0.5 mg via INTRAVENOUS

## 2017-06-12 MED ORDER — OXYCODONE HCL 5 MG PO TABS
ORAL_TABLET | ORAL | Status: AC
  Filled 2017-06-12: qty 1

## 2017-06-12 MED ORDER — ONDANSETRON HCL 4 MG/2ML IJ SOLN
INTRAMUSCULAR | Status: AC
  Filled 2017-06-12: qty 2

## 2017-06-12 MED ORDER — SCOPOLAMINE 1 MG/3DAYS TD PT72: 1.0000 | MEDICATED_PATCH | TRANSDERMAL | Status: DC

## 2017-06-12 MED ORDER — EPHEDRINE 5 MG/ML INJ
INTRAVENOUS | Status: AC
  Filled 2017-06-12: qty 10

## 2017-06-12 MED ORDER — MIDAZOLAM HCL 2 MG/2ML IJ SOLN
1.0000 mg | INTRAMUSCULAR | Status: DC | PRN
  Administered 2017-06-12: 2 mg via INTRAVENOUS

## 2017-06-12 MED ORDER — SCOPOLAMINE 1 MG/3DAYS TD PT72
1.0000 | MEDICATED_PATCH | Freq: Once | TRANSDERMAL | Status: DC | PRN
  Administered 2017-06-12: 1.5 mg via TRANSDERMAL

## 2017-06-12 MED ORDER — SUCCINYLCHOLINE CHLORIDE 200 MG/10ML IV SOSY
PREFILLED_SYRINGE | INTRAVENOUS | Status: AC
  Filled 2017-06-12: qty 10

## 2017-06-12 MED ORDER — SUGAMMADEX SODIUM 200 MG/2ML IV SOLN
INTRAVENOUS | Status: DC | PRN
  Administered 2017-06-12: 200 mg via INTRAVENOUS

## 2017-06-12 MED ORDER — MIDAZOLAM HCL 2 MG/2ML IJ SOLN
INTRAMUSCULAR | Status: AC
  Filled 2017-06-12: qty 2

## 2017-06-12 MED ORDER — ROCURONIUM BROMIDE 100 MG/10ML IV SOLN
INTRAVENOUS | Status: DC | PRN
  Administered 2017-06-12: 40 mg via INTRAVENOUS

## 2017-06-12 MED ORDER — PROPOFOL 10 MG/ML IV BOLUS
INTRAVENOUS | Status: DC | PRN
  Administered 2017-06-12: 200 mg via INTRAVENOUS

## 2017-06-12 MED ORDER — SCOPOLAMINE 1 MG/3DAYS TD PT72
MEDICATED_PATCH | TRANSDERMAL | Status: AC
  Filled 2017-06-12: qty 1

## 2017-06-12 MED ORDER — FENTANYL CITRATE (PF) 100 MCG/2ML IJ SOLN: 50.0000 ug | INTRAMUSCULAR | Status: DC | PRN

## 2017-06-12 MED ORDER — SUFENTANIL CITRATE 50 MCG/ML IV SOLN
INTRAVENOUS | Status: AC
  Filled 2017-06-12: qty 1

## 2017-06-12 MED ORDER — LIDOCAINE 2% (20 MG/ML) 5 ML SYRINGE
INTRAMUSCULAR | Status: AC
  Filled 2017-06-12: qty 5

## 2017-06-12 MED ORDER — LACTATED RINGERS IV SOLN
INTRAVENOUS | Status: DC
  Administered 2017-06-12 (×2): via INTRAVENOUS

## 2017-06-12 MED ORDER — DEXAMETHASONE SODIUM PHOSPHATE 4 MG/ML IJ SOLN
INTRAMUSCULAR | Status: DC | PRN
  Administered 2017-06-12: 10 mg via INTRAVENOUS

## 2017-06-12 MED ORDER — PROMETHAZINE HCL 25 MG/ML IJ SOLN
6.2500 mg | INTRAMUSCULAR | Status: DC | PRN
Start: 2017-06-12 — End: 2017-06-12

## 2017-06-12 MED ORDER — PHENYLEPHRINE 40 MCG/ML (10ML) SYRINGE FOR IV PUSH (FOR BLOOD PRESSURE SUPPORT)
PREFILLED_SYRINGE | INTRAVENOUS | Status: AC
  Filled 2017-06-12: qty 10

## 2017-06-12 MED ORDER — LACTATED RINGERS IV SOLN: INTRAVENOUS | Status: DC

## 2017-06-12 MED ORDER — OXYCODONE HCL 5 MG PO TABS
5.0000 mg | ORAL_TABLET | Freq: Once | ORAL | Status: AC | PRN
  Administered 2017-06-12: 5 mg via ORAL

## 2017-06-12 MED ORDER — SUFENTANIL CITRATE 50 MCG/ML IV SOLN
INTRAVENOUS | Status: DC | PRN
  Administered 2017-06-12: 5 ug via INTRAVENOUS
  Administered 2017-06-12: 10 ug via INTRAVENOUS

## 2017-06-12 MED ORDER — ONDANSETRON HCL 4 MG/2ML IJ SOLN
INTRAMUSCULAR | Status: DC | PRN
  Administered 2017-06-12: 4 mg via INTRAVENOUS

## 2017-06-12 MED ORDER — HYDROMORPHONE HCL 1 MG/ML IJ SOLN
INTRAMUSCULAR | Status: AC
  Filled 2017-06-12: qty 0.5

## 2017-06-12 MED ORDER — BUPIVACAINE HCL (PF) 0.25 % IJ SOLN
INTRAMUSCULAR | Status: DC | PRN
  Administered 2017-06-12: 4 mL

## 2017-06-12 SURGICAL SUPPLY — 25 items
BRIEF STRETCH FOR OB PAD XXL (UNDERPADS AND DIAPERS) ×2 IMPLANT
CATH ROBINSON RED A/P 16FR (CATHETERS) ×1 IMPLANT
CLIP FILSHIE TUBAL LIGA STRL (Clip) ×2 IMPLANT
CLOTH BEACON ORANGE TIMEOUT ST (SAFETY) ×1 IMPLANT
DRSG OPSITE POSTOP 3X4 (GAUZE/BANDAGES/DRESSINGS) ×1 IMPLANT
DURAPREP 26ML APPLICATOR (WOUND CARE) ×2 IMPLANT
GLOVE BIOGEL PI IND STRL 7.0 (GLOVE) ×3 IMPLANT
GLOVE BIOGEL PI INDICATOR 7.0 (GLOVE) ×3
GLOVE ECLIPSE 6.5 STRL STRAW (GLOVE) ×1 IMPLANT
GLOVE ECLIPSE 7.0 STRL STRAW (GLOVE) ×3 IMPLANT
GOWN STRL REUS W/TWL LRG LVL3 (GOWN DISPOSABLE) ×3 IMPLANT
PACK LAPAROSCOPY BASIN (CUSTOM PROCEDURE TRAY) ×2 IMPLANT
PACK TRENDGUARD 450 HYBRID PRO (MISCELLANEOUS) IMPLANT
PACK TRENDGUARD 600 HYBRD PROC (MISCELLANEOUS) IMPLANT
PAD PREP 24X48 CUFFED NSTRL (MISCELLANEOUS) ×2 IMPLANT
SLEEVE XCEL OPT CAN 5 100 (ENDOMECHANICALS) ×1 IMPLANT
SUT VIC AB 3-0 X1 27 (SUTURE) ×2 IMPLANT
SUT VICRYL 0 UR6 27IN ABS (SUTURE) ×4 IMPLANT
TOWEL OR 17X24 6PK STRL BLUE (TOWEL DISPOSABLE) ×3 IMPLANT
TRENDGUARD 450 HYBRID PRO PACK (MISCELLANEOUS) ×2
TRENDGUARD 600 HYBRID PROC PK (MISCELLANEOUS)
TROCAR BALLN 12MMX100 BLUNT (TROCAR) ×2 IMPLANT
TROCAR XCEL NON-BLD 5MMX100MML (ENDOMECHANICALS) ×1 IMPLANT
TUBING INSUFFLATION (TUBING) ×2 IMPLANT
WARMER LAPAROSCOPE (MISCELLANEOUS) ×2 IMPLANT

## 2017-06-12 NOTE — Op Note (Signed)
Preoperative diagnosis: Multiparity and Undesired fertility  Postoperative diagnosis: Same  Procedure: Laparoscopic BTL  Surgeon: Tinnie Gensanya Asmar Brozek, MD  Anesthesia: Cipriano MileGETT- Singer, James, MD  Findings: Normal uterus tubes and ovaries  Estimated blood loss: Minimal  Complications: None known  Specimens: None  Reason for procedure: 32 y.o. [id number]  who desires permanent sterility. Patient counseled, r.e. Risks benefits of BTL, including permanency of procedure, risk of failure(1:100), increased risk of ectopic.  Patient verbalized understanding and desires to proceed  Procedure: Patient was taken to the operating room was placed in dorsal lithotomy in [place] stirrups. She was prepped and draped in the usual sterile fashion. A timeout was performed. SCDs were in place. Speculum was placed inside the vagina. The cervix was visualized and grasped anteriorly with a single-tooth tenaculum. A Hulka tenaculum was placed through the cervix for uterine manipulation. The single tooth tenaculum and speculum were removed from the vagina.  Attention was then turned to the abdomen. Five cc of 0.25% Marcaine was injected at the umbilicus. Two Allis clamps were used to tent up the skin of the umbilicus a vertical one half centimeter incision was made here. The fascia was incised with the knife  And the peritoneum was entered sharply with this incision. A purse string suture of 0-Vicryl on a UR-6 was placed in the fascia. A 10 mm Hassan trocar was placed through this incision and a pneumoperitoneum was created. The patient was then placed in Trendelenburg. The uterus was identified.  The right and left tubes were identified and followed to their fimbriated ends.  Filshie clips placed across these at 1.5 cm from cornu bilaterally.  Pictures were made. Pneumoperitoneum let down and instruments removed from abdomen. Previous mentioned purse string suture tied down.  Skin closed with 3-0 Vicryl on an X-1 in a subcuticular  fashion.  Sterile dressing applied. Hulka tenaculum removed from vagina.  All instrument, needle and lap counts correct.  Patient awakened and taken to recovery in stable condition.   [name] 06/12/2017 9:11 AM

## 2017-06-12 NOTE — Discharge Instructions (Signed)
Laparoscopic Tubal Ligation, Care After °Refer to this sheet in the next few weeks. These instructions provide you with information about caring for yourself after your procedure. Your health care provider may also give you more specific instructions. Your treatment has been planned according to current medical practices, but problems sometimes occur. Call your health care provider if you have any problems or questions after your procedure. °What can I expect after the procedure? °After the procedure, it is common to have: °· A sore throat. °· Discomfort in your shoulder. °· Mild discomfort or cramping in your abdomen. °· Gas pains. °· Pain or soreness in the area where the surgical cut (incision) was made. °· A bloated feeling. °· Tiredness. °· Nausea. °· Vomiting. ° °Follow these instructions at home: °Medicines °· Take over-the-counter and prescription medicines only as told by your health care provider. °· Do not take aspirin because it can cause bleeding. °· Do not drive or operate heavy machinery while taking prescription pain medicine. °Activity °· Rest for the rest of the day. °· Return to your normal activities as told by your health care provider. Ask your health care provider what activities are safe for you. °Incision care ° °· Follow instructions from your health care provider about how to take care of your incision. Make sure you: °? Wash your hands with soap and water before you change your bandage (dressing). If soap and water are not available, use hand sanitizer. °? Change your dressing as told by your health care provider. °? Leave stitches (sutures) in place. They may need to stay in place for 2 weeks or longer. °· Check your incision area every day for signs of infection. Check for: °? More redness, swelling, or pain. °? More fluid or blood. °? Warmth. °? Pus or a bad smell. °Other Instructions °· Do not take baths, swim, or use a hot tub until your health care provider approves. You may take  showers. °· Keep all follow-up visits as told by your health care provider. This is important. °· Have someone help you with your daily household tasks for the first few days. °Contact a health care provider if: °· You have more redness, swelling, or pain around your incision. °· Your incision feels warm to the touch. °· You have pus or a bad smell coming from your incision. °· The edges of your incision break open after the sutures have been removed. °· Your pain does not improve after 2-3 days. °· You have a rash. °· You repeatedly become dizzy or light-headed. °· Your pain medicine is not helping. °· You are constipated. °Get help right away if: °· You have a fever. °· You faint. °· You have increasing pain in your abdomen. °· You have severe pain in one or both of your shoulders. °· You have fluid or blood coming from your sutures or from your vagina. °· You have shortness of breath or difficulty breathing. °· You have chest pain or leg pain. °· You have ongoing nausea, vomiting, or diarrhea. °This information is not intended to replace advice given to you by your health care provider. Make sure you discuss any questions you have with your health care provider. ° ° °Post Anesthesia Home Care Instructions ° °Activity: °Get plenty of rest for the remainder of the day. A responsible individual must stay with you for 24 hours following the procedure.  °For the next 24 hours, DO NOT: °-Drive a car °-Operate machinery °-Drink alcoholic beverages °-Take any medication unless   instructed by your physician °-Make any legal decisions or sign important papers. ° °Meals: °Start with liquid foods such as gelatin or soup. Progress to regular foods as tolerated. Avoid greasy, spicy, heavy foods. If nausea and/or vomiting occur, drink only clear liquids until the nausea and/or vomiting subsides. Call your physician if vomiting continues. ° °Special Instructions/Symptoms: °Your throat may feel dry or sore from the anesthesia or  the breathing tube placed in your throat during surgery. If this causes discomfort, gargle with warm salt water. The discomfort should disappear within 24 hours. ° °If you had a scopolamine patch placed behind your ear for the management of post- operative nausea and/or vomiting: °1. The medication in the patch is effective for 72 hours, after which it should be removed.  Wrap patch in a tissue and discard in the trash. Wash hands thoroughly with soap and water. °2. You may remove the patch earlier than 72 hours if you experience unpleasant side effects which may include dry mouth, dizziness or visual disturbances. °3. Avoid touching the patch. Wash your hands with soap and water after contact with the patch. ° ° ° °

## 2017-06-12 NOTE — Anesthesia Procedure Notes (Signed)
Procedure Name: Intubation Date/Time: 06/12/2017 8:37 AM Performed by: Willa Frater, CRNA Pre-anesthesia Checklist: Patient identified, Emergency Drugs available, Suction available and Patient being monitored Patient Re-evaluated:Patient Re-evaluated prior to induction Oxygen Delivery Method: Circle system utilized Preoxygenation: Pre-oxygenation with 100% oxygen Induction Type: IV induction Ventilation: Mask ventilation without difficulty Laryngoscope Size: Mac and 3 Grade View: Grade I Tube type: Oral Tube size: 7.0 mm Number of attempts: 1 Airway Equipment and Method: Stylet and Oral airway Placement Confirmation: ETT inserted through vocal cords under direct vision,  positive ETCO2 and breath sounds checked- equal and bilateral Secured at: 21 cm Tube secured with: Tape Dental Injury: Teeth and Oropharynx as per pre-operative assessment

## 2017-06-12 NOTE — Anesthesia Preprocedure Evaluation (Signed)
Anesthesia Evaluation  Patient identified by MRN, date of birth, ID band Patient awake    Reviewed: Allergy & Precautions, NPO status , Patient's Chart, lab work & pertinent test results  History of Anesthesia Complications Negative for: history of anesthetic complications  Airway Mallampati: II  TM Distance: >3 FB Neck ROM: Full    Dental no notable dental hx. (+) Teeth Intact, Dental Advisory Given   Pulmonary former smoker,    Pulmonary exam normal breath sounds clear to auscultation       Cardiovascular negative cardio ROS Normal cardiovascular exam Rhythm:Regular Rate:Normal     Neuro/Psych  Neuromuscular disease    GI/Hepatic negative GI ROS, Neg liver ROS,   Endo/Other  Obesity   Renal/GU negative Renal ROS     Musculoskeletal Hx/o sciatica   Abdominal (+) + obese,   Peds  (+) Congenital Heart Disease Hematology  (+) anemia ,   Anesthesia Other Findings   Reproductive/Obstetrics                             Anesthesia Physical  Anesthesia Plan  ASA: II  Anesthesia Plan: General   Post-op Pain Management:    Induction: Intravenous  PONV Risk Score and Plan: 3 and Ondansetron, Dexamethasone and Scopolamine patch - Pre-op  Airway Management Planned: Oral ETT  Additional Equipment:   Intra-op Plan:   Post-operative Plan: Extubation in OR  Informed Consent: I have reviewed the patients History and Physical, chart, labs and discussed the procedure including the risks, benefits and alternatives for the proposed anesthesia with the patient or authorized representative who has indicated his/her understanding and acceptance.   Dental advisory given  Plan Discussed with: Anesthesiologist and CRNA  Anesthesia Plan Comments:         Anesthesia Quick Evaluation

## 2017-06-12 NOTE — Interval H&P Note (Signed)
History and Physical Interval Note:  06/12/2017 8:17 AM  Gloria Weber  has presented today for surgery, with the diagnosis of Undesired Fertility  The various methods of treatment have been discussed with the patient and family. After consideration of risks, benefits and other options for treatment, the patient has consented to  Procedure(s): LAPAROSCOPIC TUBAL LIGATION (Bilateral) as a surgical intervention .  The patient's history has been reviewed, patient examined, no change in status, stable for surgery.  I have reviewed the patient's chart and labs.  Questions were answered to the patient's satisfaction.     Reva Boresanya S Zayah Keilman

## 2017-06-12 NOTE — Anesthesia Postprocedure Evaluation (Signed)
Anesthesia Post Note  Patient: Gloria Weber  Procedure(s) Performed: LAPAROSCOPIC TUBAL LIGATION WITH FILSHIE CLIPS (Bilateral Abdomen)     Patient location during evaluation: PACU Anesthesia Type: General Level of consciousness: sedated Pain management: pain level controlled Vital Signs Assessment: post-procedure vital signs reviewed and stable Respiratory status: spontaneous breathing and respiratory function stable Cardiovascular status: stable Postop Assessment: no apparent nausea or vomiting Anesthetic complications: no    Last Vitals:  Vitals:   06/12/17 1000 06/12/17 1038  BP: 107/74 119/82  Pulse: 72 68  Resp: 16 16  Temp:  36.6 C  SpO2: 100% 100%    Last Pain:  Vitals:   06/12/17 1038  TempSrc: Oral  PainSc: 2                  Xylina Rhoads DANIEL

## 2017-06-12 NOTE — Transfer of Care (Signed)
Immediate Anesthesia Transfer of Care Note  Patient: Gloria Weber  Procedure(s) Performed: LAPAROSCOPIC TUBAL LIGATION WITH FILSHIE CLIPS (Bilateral )  Patient Location: PACU  Anesthesia Type:General  Level of Consciousness: awake, alert  and oriented  Airway & Oxygen Therapy: Patient Spontanous Breathing and Patient connected to face mask oxygen  Post-op Assessment: Report given to RN and Post -op Vital signs reviewed and stable  Post vital signs: Reviewed and stable  Last Vitals:  Vitals:   06/12/17 0736  BP: 120/74  Pulse: 80  Resp: 20  Temp: 36.9 C  SpO2: 100%    Last Pain:  Vitals:   06/12/17 0736  TempSrc: Oral         Complications: No apparent anesthesia complications

## 2017-06-13 ENCOUNTER — Encounter (HOSPITAL_BASED_OUTPATIENT_CLINIC_OR_DEPARTMENT_OTHER): Payer: Self-pay | Admitting: Family Medicine

## 2017-06-14 ENCOUNTER — Encounter (HOSPITAL_COMMUNITY): Payer: Self-pay

## 2017-06-27 ENCOUNTER — Encounter: Payer: Self-pay | Admitting: Family Medicine

## 2017-07-04 ENCOUNTER — Ambulatory Visit (INDEPENDENT_AMBULATORY_CARE_PROVIDER_SITE_OTHER): Payer: 59 | Admitting: Family Medicine

## 2017-07-04 ENCOUNTER — Encounter: Payer: Self-pay | Admitting: Family Medicine

## 2017-07-04 VITALS — BP 119/92 | HR 91 | Wt 177.1 lb

## 2017-07-04 DIAGNOSIS — Z09 Encounter for follow-up examination after completed treatment for conditions other than malignant neoplasm: Secondary | ICD-10-CM

## 2017-07-04 DIAGNOSIS — Z9889 Other specified postprocedural states: Secondary | ICD-10-CM

## 2017-07-04 NOTE — Progress Notes (Signed)
   Subjective:    Patient ID: Gloria Weber is a 32 y.o. female presenting with Follow-up  on 07/04/2017  HPI: S/p lap BTL on 06/12/17. Did have some foul-smelling drainage at umbilicus. No other complaints. No fever, chill, N/V.  Review of Systems  Constitutional: Negative for chills and fever.  Respiratory: Negative for shortness of breath.   Cardiovascular: Negative for chest pain.  Gastrointestinal: Negative for abdominal pain, nausea and vomiting.  Genitourinary: Negative for dysuria.  Skin: Negative for rash.      Objective:    BP (!) 119/92   Pulse 91   Wt 177 lb 1.6 oz (80.3 kg)   LMP 06/27/2017   BMI 32.39 kg/m  Physical Exam  Constitutional: She is oriented to person, place, and time. She appears well-developed and well-nourished. No distress.  HENT:  Head: Normocephalic and atraumatic.  Eyes: No scleral icterus.  Neck: Neck supple.  Cardiovascular: Normal rate.  Pulmonary/Chest: Effort normal.  Abdominal: Soft.  Neurological: She is alert and oriented to person, place, and time.  Skin: Skin is warm and dry.  Umbilicus is without erythema. Appears well-healed. Small suture noted at base.  Psychiatric: She has a normal mood and affect.        Assessment & Plan:  Postop check - doing well--H2O2 at incision bid-tid.  . Return if symptoms worsen or fail to improve.  Gloria Weber 07/04/2017 4:18 PM

## 2017-07-05 ENCOUNTER — Encounter: Payer: Self-pay | Admitting: Family Medicine

## 2018-01-27 IMAGING — US US MFM OB TRANSVAGINAL
1 series · 13 of 28 positions shown · non-contrast
Comparison: none

[Series 1: us mfm ob transvaginal · 13 of 90 slices shown]
[im 4/90]
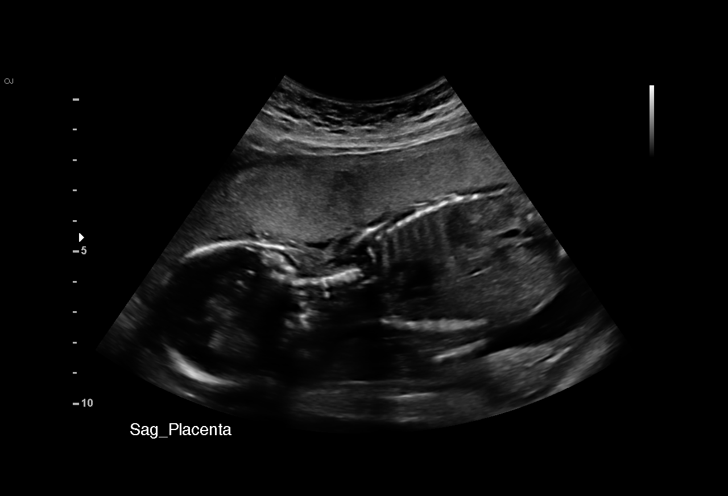
[im 10/90]
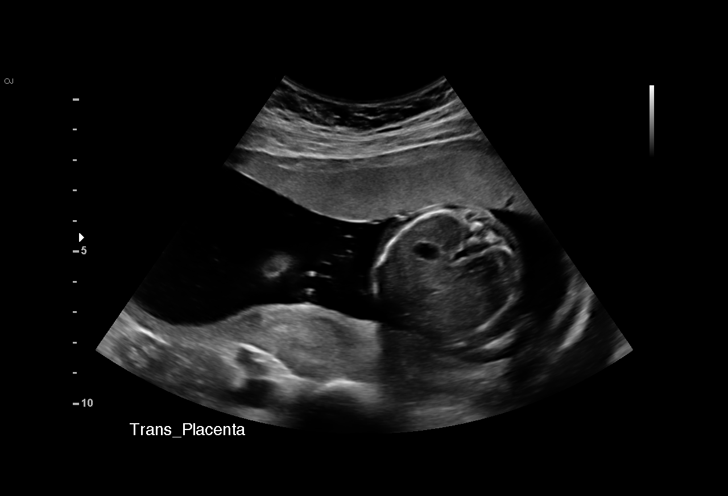
[im 17/90]
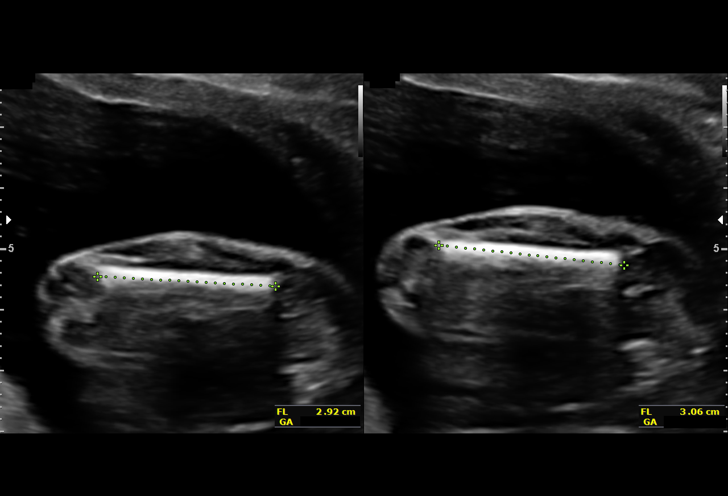
[im 24/90]
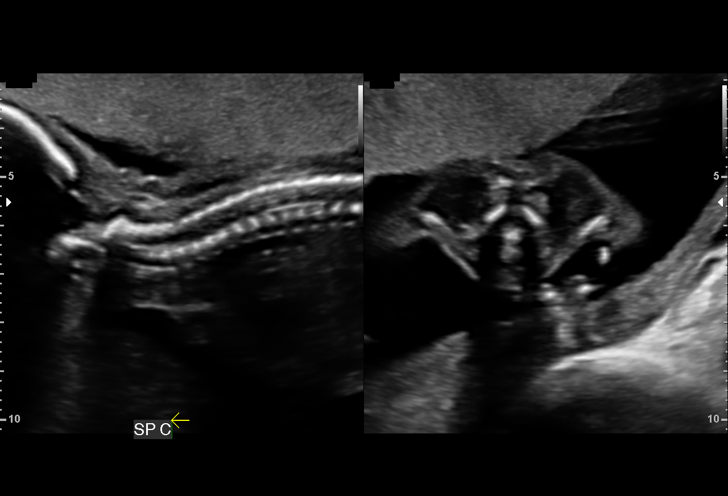
[im 30/90]
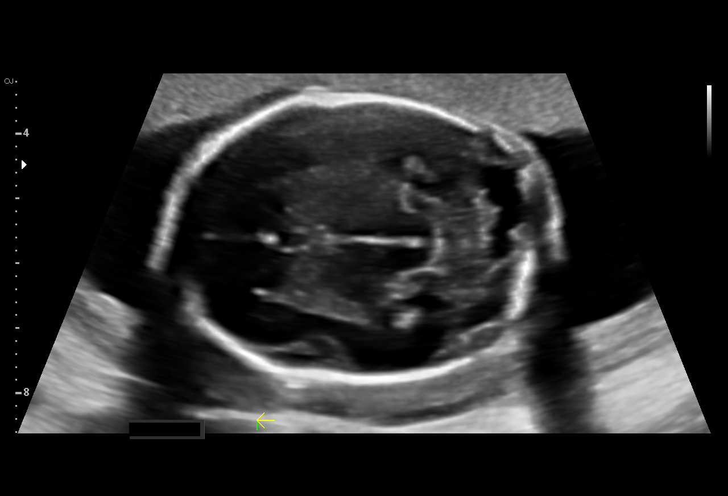
[im 37/90]
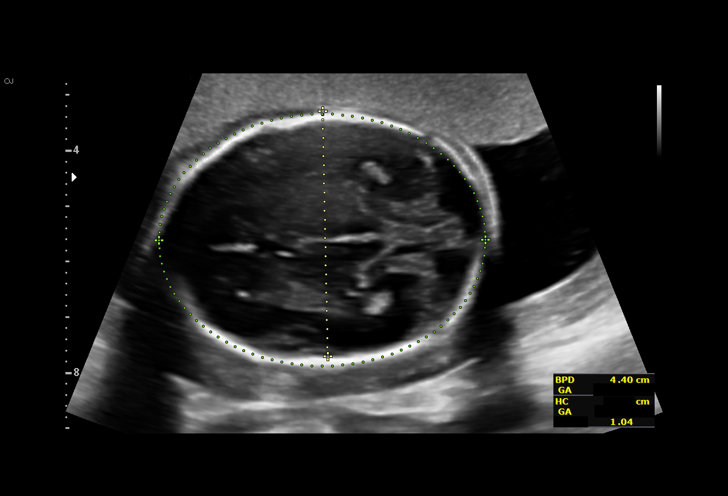
[im 47/90]
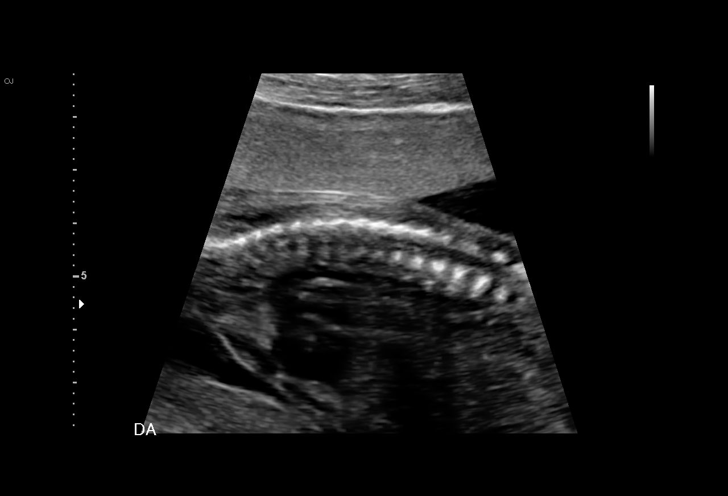
[im 53/90]
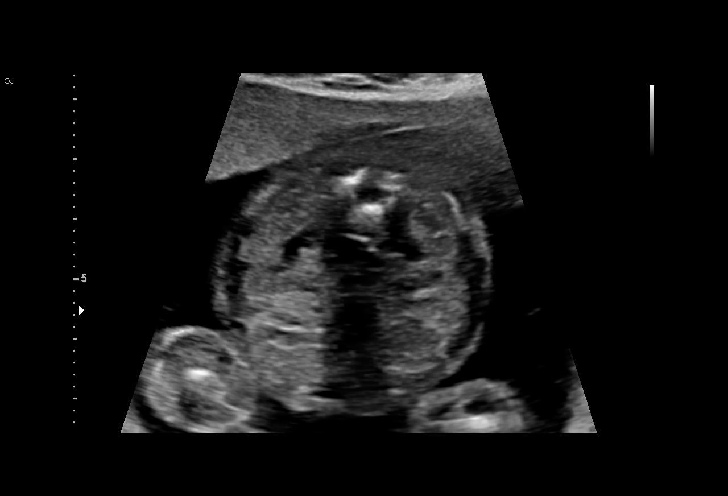
[im 60/90]
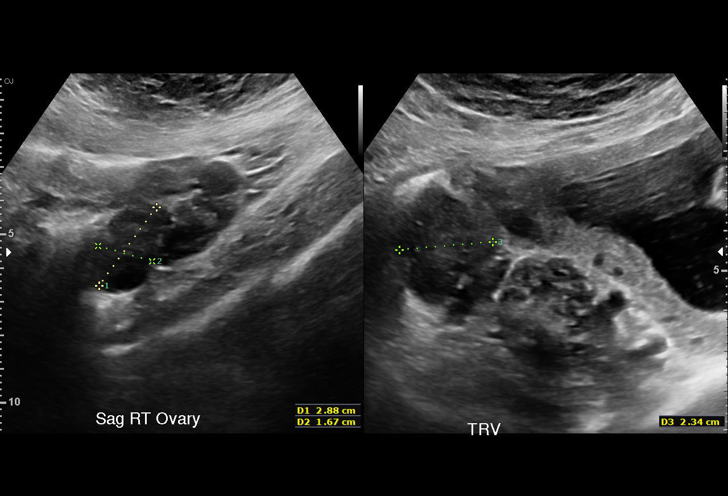
[im 66/90]
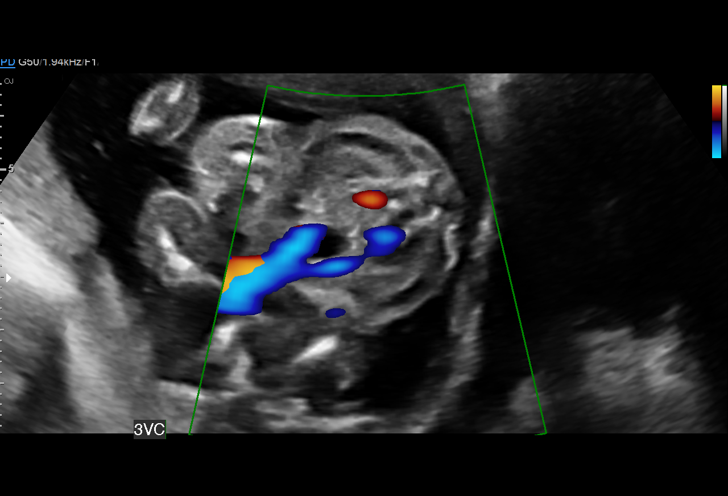
[im 73/90]
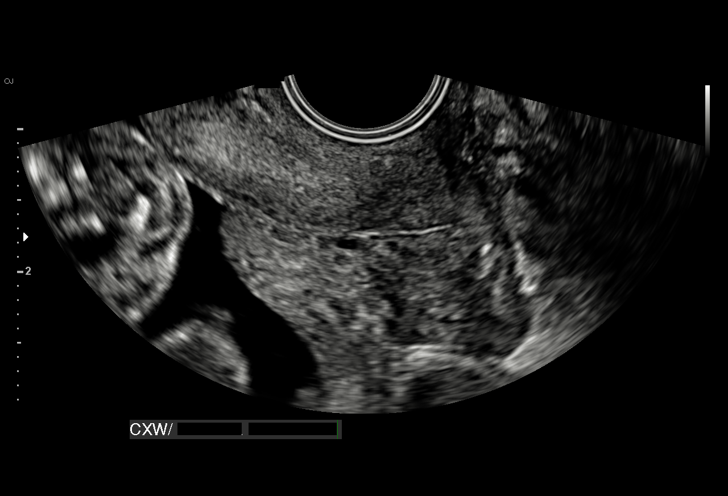
[im 80/90]
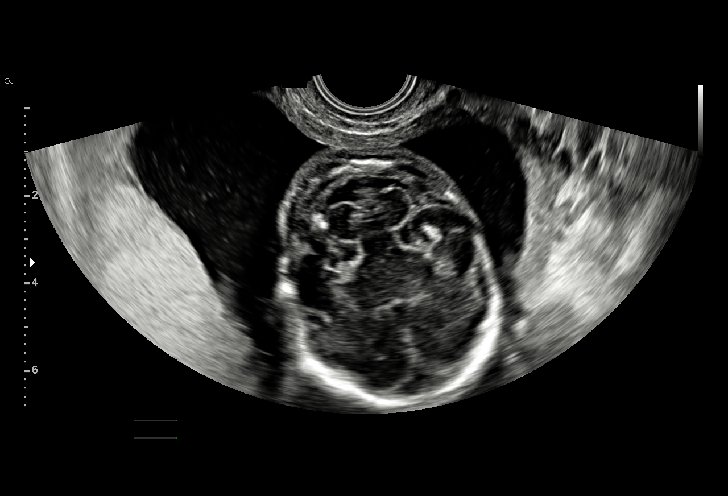
[im 86/90]
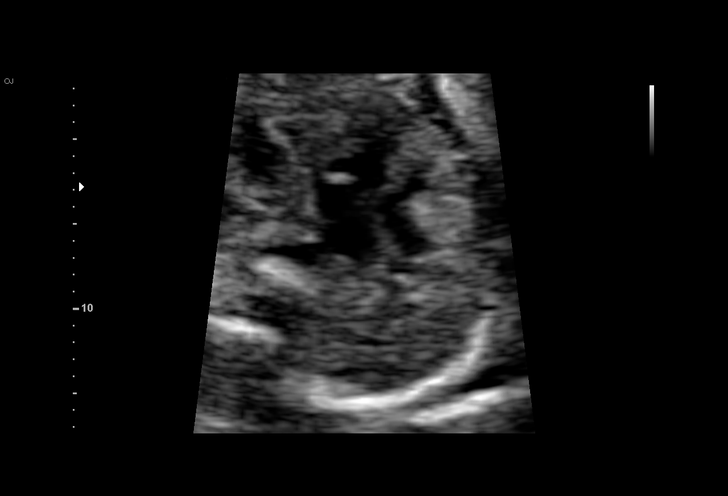

[13 of 28 positions shown; findings below may reference images not displayed]

Indications

20 weeks gestation of pregnancy
Encounter for antenatal screening for
malformations
Poor obstetric history: Previous preterm
delivery(17wks)due to PPROM
Family history of congenital anomaly(prior
child)
Previous cervical surgery (LEEP)
OB History

Blood Type:            Height:  5'5"   Weight (lb):  166       BMI:
Gravidity:    3         Term:   2        Prem:   1        SAB:   0
TOP:          0       Ectopic:  0        Living: 2
Fetal Evaluation

Num Of Fetuses:     1
Fetal Heart         157
Rate(bpm):
Cardiac Activity:   Observed
Presentation:       Breech
Placenta:           Anterior, above cervical os
P. Cord Insertion:  Visualized

Amniotic Fluid
AFI FV:      Subjectively within normal limits

Largest Pocket(cm)
4.72
Biometry

BPD:      43.4  mm     G. Age:  19w 1d         17  %    CI:        70.71   %    70 - 86
FL/HC:      18.4   %    16.8 -
HC:      164.5  mm     G. Age:  19w 1d         11  %    HC/AC:      1.04        1.09 -
AC:      158.7  mm     G. Age:  21w 0d         76  %    FL/BPD:     69.6   %
FL:       30.2  mm     G. Age:  19w 3d         21  %    FL/AC:      19.0   %    20 - 24
HUM:      29.2  mm     G. Age:  19w 4d         41  %
CER:      19.5  mm     G. Age:  18w 5d         16  %
NFT:       4.4  mm
CM:        4.9  mm

Est. FW:     331  gm    0 lb 12 oz      51  %
Gestational Age

LMP:           11w 3d        Date:  08/21/16                 EDD:   05/28/17
U/S Today:     19w 5d                                        EDD:   03/31/17
Best:          20w 0d     Det. By:  Early Ultrasound         EDD:   03/29/17
(09/14/16)
Anatomy

Cranium:               Appears normal         Aortic Arch:            Appears normal
Cavum:                 Appears normal         Ductal Arch:            Appears normal
Ventricles:            Appears normal         Diaphragm:              Appears normal
Choroid Plexus:        Bilateral choroid      Stomach:                Appears normal, left
plexus cysts
sided
Cerebellum:            Appears normal         Abdomen:                Appears normal
Posterior Fossa:       Appears normal         Abdominal Wall:         Not well visualized
Nuchal Fold:           Appears normal         Cord Vessels:           Appears normal (3
vessel cord)
Face:                  Orbits nl; profile not Kidneys:                Appear normal
well visualized
Lips:                  Appears normal         Bladder:                Appears normal
Thoracic:              Appears normal         Spine:                  Appears normal
Heart:                 Appears normal         Upper Extremities:      Appears normal
(4CH, axis, and situs
RVOT:                  Appears normal         Lower Extremities:      Appears normal
LVOT:                  Appears normal

Other:  Fetus appears to be a female. Technically difficult due to fetal
position. open hands visualized.
Cervix Uterus Adnexa

Cervix
Length:              3  cm.
Normal appearance by transvaginal scan

Uterus
No abnormality visualized.

Left Ovary
Within normal limits.

Right Ovary
Within normal limits.

Cul De Sac:   No free fluid seen.
Adnexa:       No abnormality visualized.
Comments

Choroid plexus cysts were noted.  This is a common variant
(1-2%) of all pregnancies, and carries a small association
with Trisomy 18.  However, there were no other ultrasound
stigmata of Trisomy 18, making the possibility of Trisomy 18
less likely.
Impression

Single IUP at 20w 0d
Bilateral choroid plexus cysts noted
Limited views of the abdominal cord insertion obtained
The remainder of the fetal anatomy appears normal
No other markers associated with aneuploidy were
appreciated
Anterior placenta without previa
Normal amniotic fluid volume

TVUS - cervical length 3 cm without funneling or dynamic
changes
Recommendations

Ultrasound for cervical length in 2 weeks due to hx of preterm
delivery
Ultrasound for growth and cervical length in 4 weeks

## 2018-02-24 IMAGING — US US MFM OB FOLLOW-UP
1 series · 14 of 28 positions shown · non-contrast
Comparison: none

[Series 1: us mfm ob follow-up · 73 acquisitions, 14 frames shown]
[im 3/73]
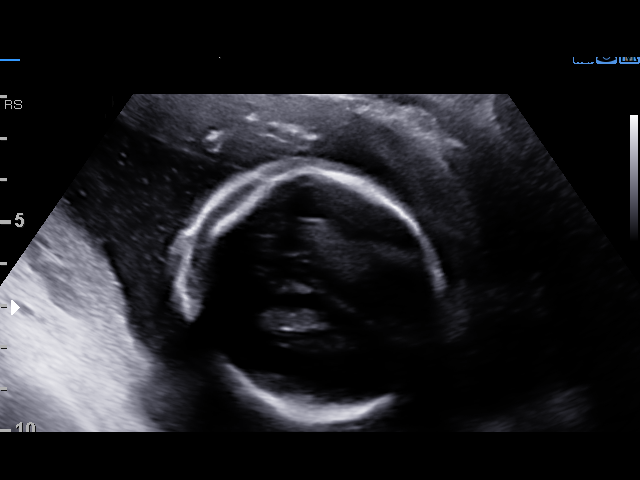
[im 9/73]
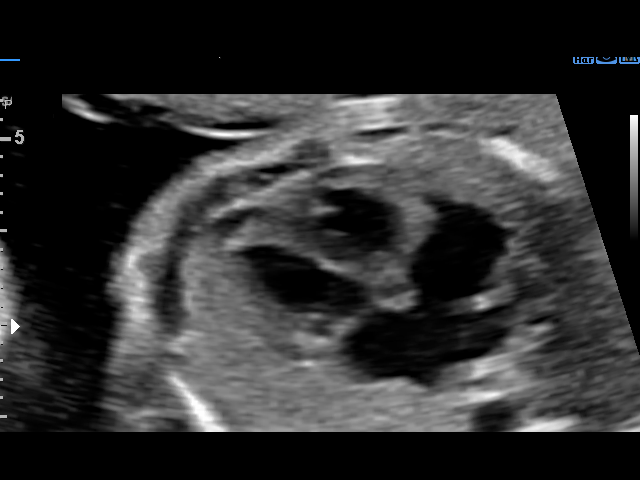
[im 14/73]
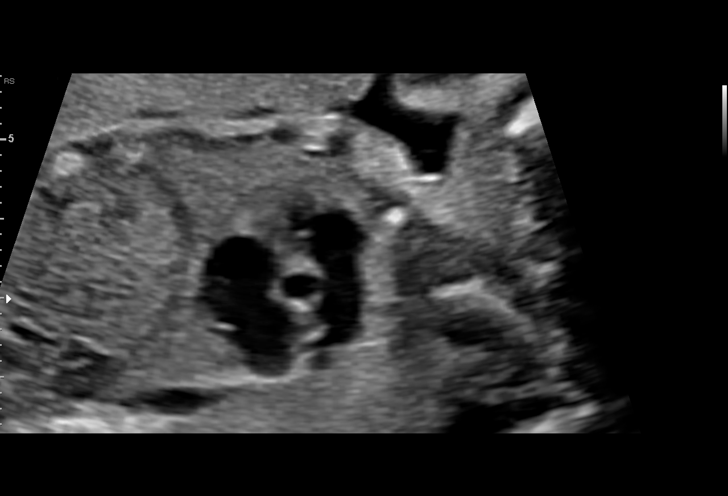
[im 19/73]
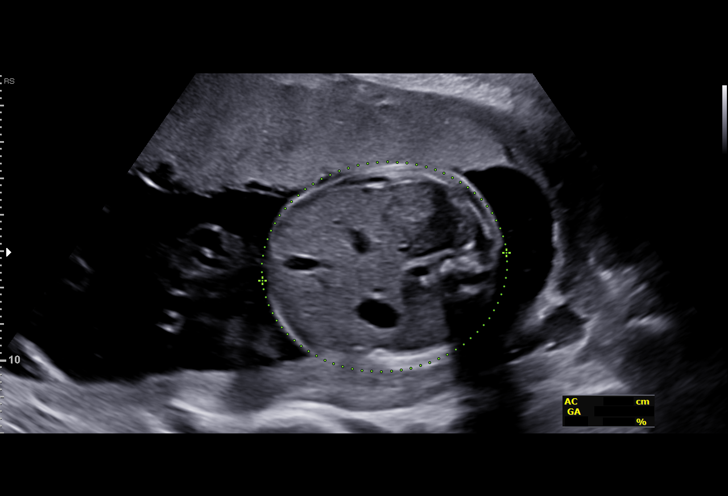
[im 25/73]
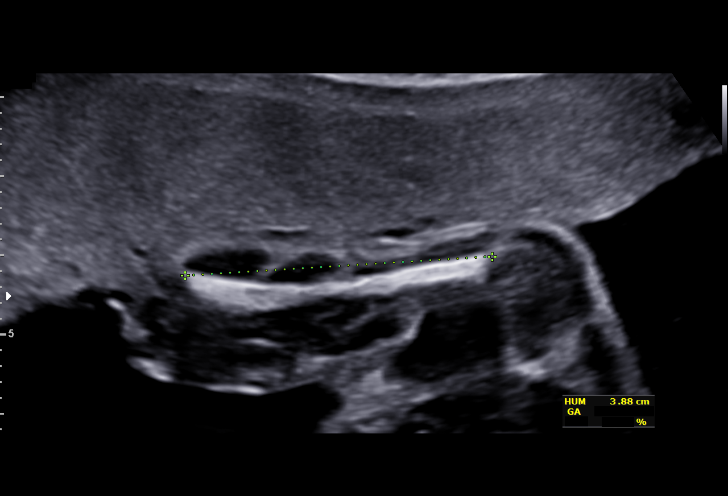
[im 30/73]
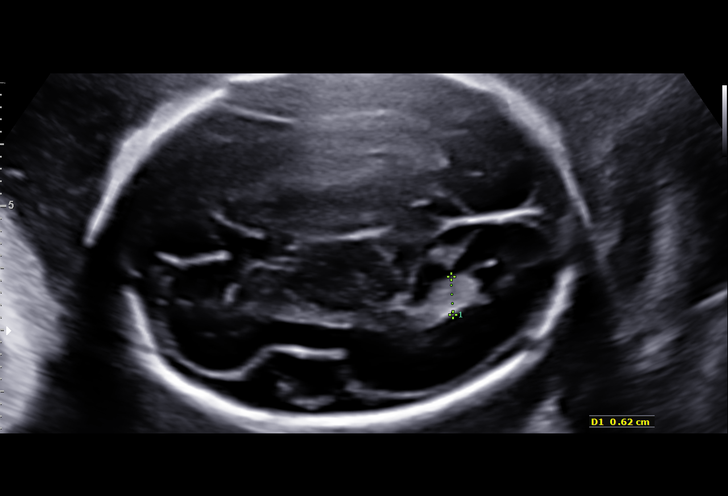
[im 35/73]
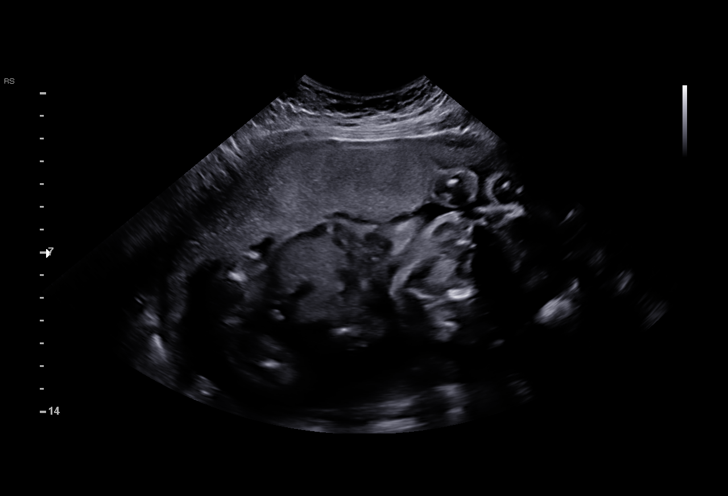
[im 41/73]
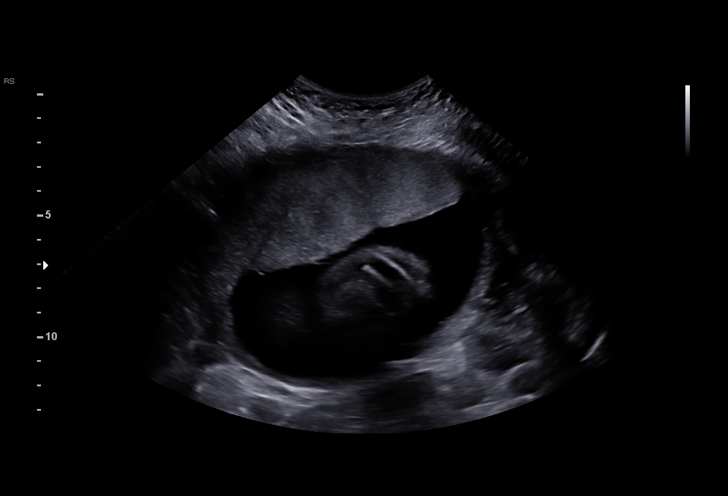
[im 46/73]
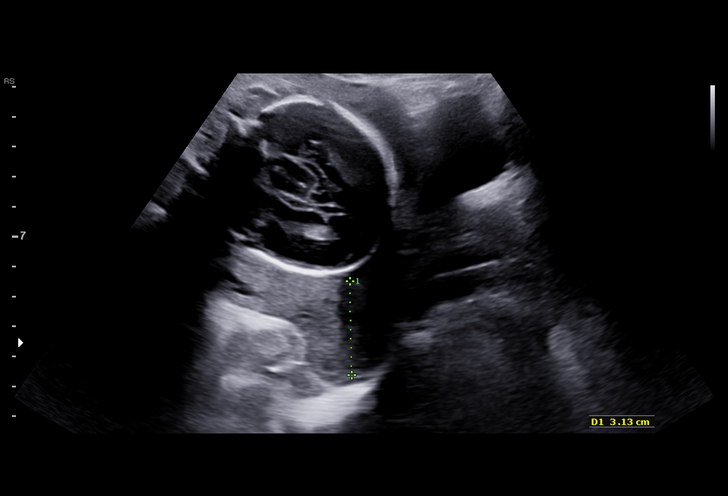
[im 51/73]
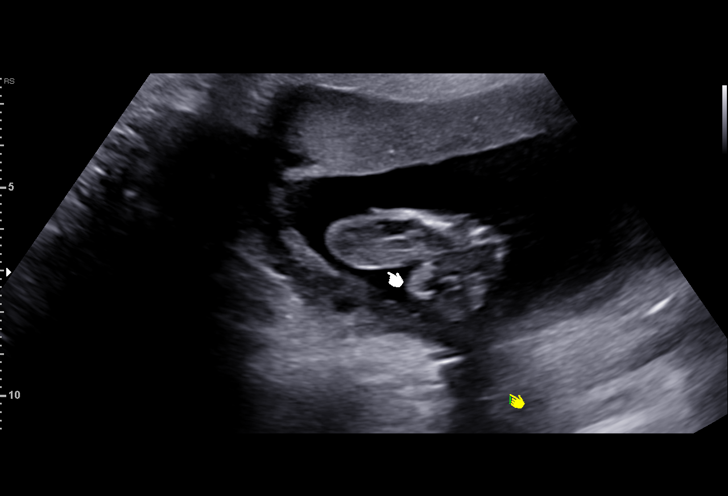
[im 57/73]
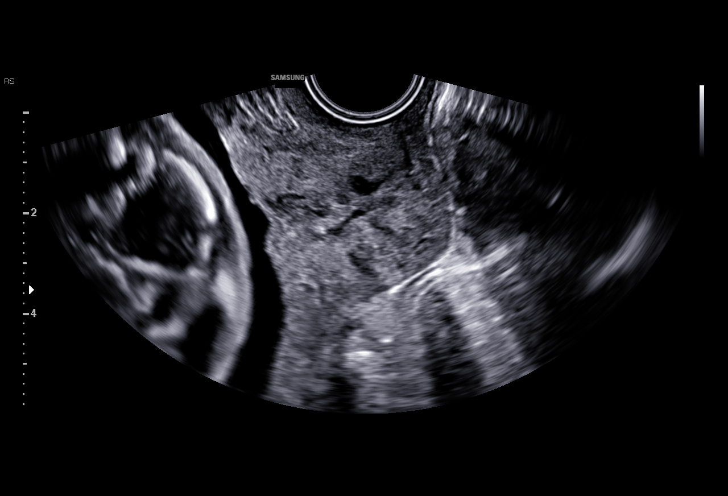
[im 62/73]
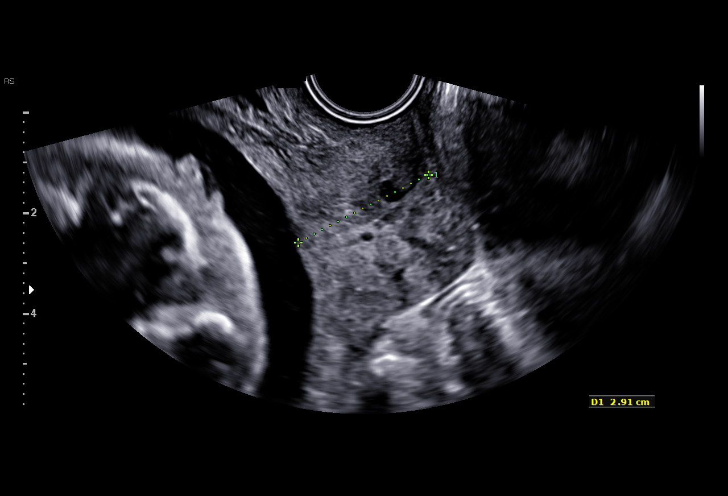
[im 67/73]
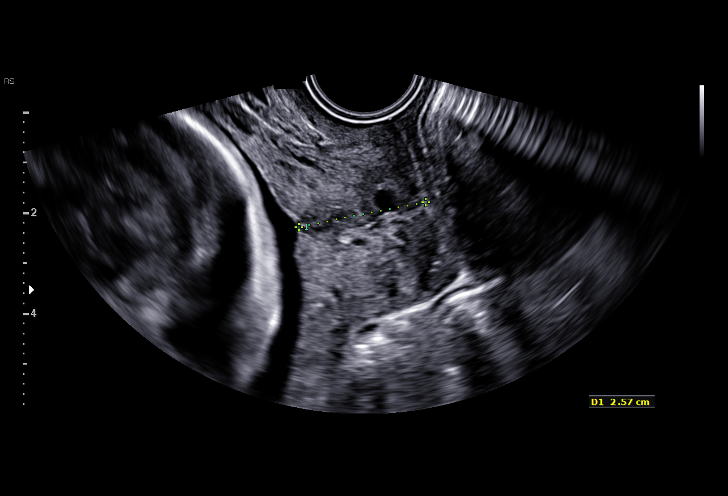
[im 73/73]
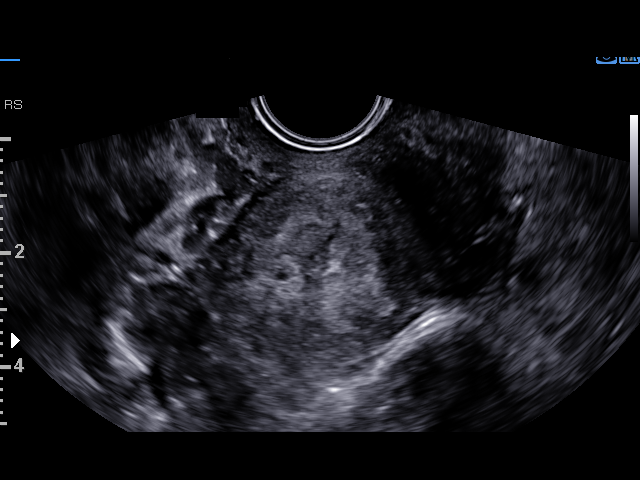

[14 of 28 positions shown; findings below may reference images not displayed]

1  ANNE-ROOS GAJADHAR            [id number]      [id number]     [id number]
2  ANNE-ROOS GAJADHAR            [id number]      [id number]     [id number]
Indications

24 weeks gestation of pregnancy
Poor obstetric history: Previous preterm
delivery (71wks) due to MELS and PPROM
Previous cervical surgery (LEEP)
Encounter for cervical length
Encounter for other antenatal screening
follow-up
OB History

Blood Type:            Height:  5'5"   Weight (lb):  166      BMI:
Gravidity:    3         Term:   2        Prem:   1        SAB:   0
TOP:          0       Ectopic:  0        Living: 2
Fetal Evaluation

Num Of Fetuses:     1
Fetal Heart         137
Rate(bpm):
Cardiac Activity:   Observed
Presentation:       Cephalic
Placenta:           Anterior, above cervical os
P. Cord Insertion:  Previously Visualized

Amniotic Fluid
AFI FV:      Subjectively within normal limits

Largest Pocket(cm)
6.41
Biometry

BPD:      57.7  mm     G. Age:  23w 5d         31  %    CI:         67.6   %   70 - 86
FL/HC:      18.6   %   18.7 -
HC:      224.6  mm     G. Age:  24w 3d         51  %    HC/AC:      1.12       1.05 -
AC:      201.1  mm     G. Age:  24w 5d         64  %    FL/BPD:     72.4   %   71 - 87
FL:       41.8  mm     G. Age:  23w 4d         25  %    FL/AC:      20.8   %   20 - 24
HUM:      38.7  mm     G. Age:  23w 5d         35  %

Est. FW:     676  gm      1 lb 8 oz     56  %
Gestational Age

LMP:           15w 3d       Date:   08/21/16                 EDD:   05/28/17
U/S Today:     24w 1d                                        EDD:   03/28/17
Best:          24w 0d    Det. By:   Early Ultrasound         EDD:   03/29/17
(09/14/16)
Anatomy

Cranium:               Appears normal         Aortic Arch:            Previously seen
Cavum:                 Previously seen        Ductal Arch:            Previously seen
Ventricles:            Appears normal         Diaphragm:              Previously seen
Choroid Plexus:        Appears normal         Stomach:                Appears normal, left
sided
Cerebellum:            Previously seen        Abdomen:                Appears normal
Posterior Fossa:       Previously seen        Abdominal Wall:         Appears nml (cord
insert, abd wall)
Nuchal Fold:           Not applicable (>20    Cord Vessels:           Previously seen
wks GA)
Face:                  Orbits nl; profile not Kidneys:                Appear normal
well visualized
Lips:                  Previously seen        Bladder:                Appears normal
Thoracic:              Appears normal         Spine:                  Previously seen
Heart:                 Appears normal         Upper Extremities:      Previously seen
(4CH, axis, and situs
RVOT:                  Appears normal         Lower Extremities:      Previously seen
LVOT:                  Appears normal

Other:  Fetus appears to be a female. Technically difficult due to fetal
position. open hands previously visualized.
Cervix Uterus Adnexa

Cervix
Length:           3.08  cm.
Measured transvaginally.

Uterus
No abnormality visualized.

Left Ovary
Not visualized.

Right Ovary
Not visualized.

Adnexa:       No abnormality visualized. No adnexal mass
visualized.
Impression

SIUP at 24+0 weeks
Normal interval anatomy; anatomic survey complete except
for profile
Normal amniotic fluid volume
Appropriate interval growth with EFW at the 56th %tile
EV views of cervix:  normal length without funneling
Recommendations

CL in 2 and 4 weeks
Growth in 4 weeks

## 2018-10-30 ENCOUNTER — Emergency Department (HOSPITAL_COMMUNITY)
Admission: EM | Admit: 2018-10-30 | Discharge: 2018-10-31 | Disposition: A | Discharge status: Home / Self Care | Payer: PRIVATE HEALTH INSURANCE | Attending: Emergency Medicine | Admitting: Emergency Medicine

## 2018-10-30 ENCOUNTER — Encounter (HOSPITAL_COMMUNITY): Payer: Self-pay

## 2018-10-30 ENCOUNTER — Emergency Department (HOSPITAL_COMMUNITY): Payer: PRIVATE HEALTH INSURANCE

## 2018-10-30 ENCOUNTER — Other Ambulatory Visit: Payer: Self-pay

## 2018-10-30 DIAGNOSIS — N898 Other specified noninflammatory disorders of vagina: Secondary | ICD-10-CM | POA: Diagnosis not present

## 2018-10-30 DIAGNOSIS — F909 Attention-deficit hyperactivity disorder, unspecified type: Secondary | ICD-10-CM | POA: Diagnosis not present

## 2018-10-30 DIAGNOSIS — N760 Acute vaginitis: Secondary | ICD-10-CM | POA: Insufficient documentation

## 2018-10-30 DIAGNOSIS — Z87891 Personal history of nicotine dependence: Secondary | ICD-10-CM | POA: Diagnosis not present

## 2018-10-30 DIAGNOSIS — F1120 Opioid dependence, uncomplicated: Secondary | ICD-10-CM | POA: Insufficient documentation

## 2018-10-30 DIAGNOSIS — Z79899 Other long term (current) drug therapy: Secondary | ICD-10-CM | POA: Diagnosis not present

## 2018-10-30 DIAGNOSIS — R1031 Right lower quadrant pain: Secondary | ICD-10-CM

## 2018-10-30 DIAGNOSIS — B9689 Other specified bacterial agents as the cause of diseases classified elsewhere: Secondary | ICD-10-CM

## 2018-10-30 LAB — WET PREP, GENITAL
Sperm: NONE SEEN
Trich, Wet Prep: NONE SEEN
Yeast Wet Prep HPF POC: NONE SEEN

## 2018-10-30 LAB — COMPREHENSIVE METABOLIC PANEL
ALT: 24 U/L (ref 0–44)
AST: 24 U/L (ref 15–41)
Albumin: 4.8 g/dL (ref 3.5–5.0)
Alkaline Phosphatase: 77 U/L (ref 38–126)
Anion gap: 10 (ref 5–15)
BUN: 8 mg/dL (ref 6–20)
CO2: 23 mmol/L (ref 22–32)
Calcium: 9 mg/dL (ref 8.9–10.3)
Chloride: 106 mmol/L (ref 98–111)
Creatinine, Ser: 0.53 mg/dL (ref 0.44–1.00)
GFR calc Af Amer: >60 mL/min (ref >60)
GFR calc non Af Amer: >60 mL/min (ref >60)
Glucose, Bld: 71 mg/dL (ref 70–99)
Potassium: 3.4 mmol/L — ABNORMAL LOW (ref 3.5–5.1)
Sodium: 139 mmol/L (ref 135–145)
Total Bilirubin: 0.2 mg/dL — ABNORMAL LOW (ref 0.3–1.2)
Total Protein: 8.4 g/dL — ABNORMAL HIGH (ref 6.5–8.1)

## 2018-10-30 LAB — CBC
HCT: 44.2 % (ref 36.0–46.0)
Hemoglobin: 13.8 g/dL (ref 12.0–15.0)
MCH: 29.5 pg (ref 26.0–34.0)
MCHC: 31.2 g/dL (ref 30.0–36.0)
MCV: 94.4 fL (ref 80.0–100.0)
Platelets: 267 10*3/uL (ref 150–400)
RBC: 4.68 MIL/uL (ref 3.87–5.11)
RDW: 12.4 % (ref 11.5–15.5)
WBC: 12.5 10*3/uL — ABNORMAL HIGH (ref 4.0–10.5)
nRBC: 0 % (ref 0.0–0.2)

## 2018-10-30 LAB — URINALYSIS, ROUTINE W REFLEX MICROSCOPIC
Bacteria, UA: NONE SEEN
Bilirubin Urine: NEGATIVE
Glucose, UA: NEGATIVE mg/dL
Ketones, ur: NEGATIVE mg/dL
Leukocytes,Ua: NEGATIVE
Nitrite: NEGATIVE
Protein, ur: NEGATIVE mg/dL
Specific Gravity, Urine: 1.004 — ABNORMAL LOW (ref 1.005–1.030)
pH: 6 (ref 5.0–8.0)

## 2018-10-30 LAB — LIPASE, BLOOD: Lipase: 31 U/L (ref 11–51)

## 2018-10-30 LAB — I-STAT BETA HCG BLOOD, ED (MC, WL, AP ONLY): I-stat hCG, quantitative: <5 m[IU]/mL (ref <5)

## 2018-10-30 MED ORDER — MORPHINE SULFATE (PF) 4 MG/ML IV SOLN
4.0000 mg | Freq: Once | INTRAVENOUS | Status: AC
  Administered 2018-10-30: 4 mg via INTRAVENOUS
  Filled 2018-10-30: qty 1

## 2018-10-30 MED ORDER — SODIUM CHLORIDE (PF) 0.9 % IJ SOLN
INTRAMUSCULAR | Status: AC
  Filled 2018-10-30: qty 50

## 2018-10-30 MED ORDER — SODIUM CHLORIDE 0.9% FLUSH
3.0000 mL | Freq: Once | INTRAVENOUS | Status: AC
  Administered 2018-10-30: 3 mL via INTRAVENOUS

## 2018-10-30 NOTE — ED Notes (Signed)
ED Provider at bedside. 

## 2018-10-30 NOTE — ED Provider Notes (Signed)
[facility] DEPT Provider Note   CSN: [id number] Arrival date & time: 10/30/18  1830    History   Chief Complaint Chief Complaint  Patient presents with   Abdominal Pain    HPI Gloria Weber is a 33 y.o. female who presents to the ED complaining of gradual onset, intermittent, RLQ abdominal pain x "months", most recent episode beginning last night. Pt also complains of white vaginal discharge and urinary frequency at nighttime with the pain. She states the pain began shortly after having a tubal ligation done about 1 year ago; she has been seen by her PCP for same and reports getting bloodwork and a urinalysis done without any findings. Pt has hx of sciatica and takes Tramadol for pain; states she has been taking this for her low back pain but it has not touched her RLQ pain. LNMP 05/16. Pt is sexually active but does not use protection; last intercourse about 1 month ago. Denies fever, chills, nausea, vomiting, diarrhea, constipation, hematuria, pelvic pain, vaginal itching, vaginal bleeding, or any other associated symptoms. PSHx includes tubal ligation.        Past Medical History:  Diagnosis Date   ADD (attention deficit disorder)    Sciatica     Patient Active Problem List   Diagnosis Date Noted   Encounter for sterilization 06/06/2017   Vaginal discharge 12/20/2016   Unwanted fertility 12/20/2016   Adult ADHD 11/22/2016   Opioid dependence ([place]) 11/22/2016   Drug dependence affecting pregnancy, antepartum 11/22/2016   Sciatica    Abnormal Pap smear of cervix 10/30/2016   History of LEEP (loop electrosurgical excision procedure) of cervix complicating pregnancy, unspecified trimester 10/25/2016    Past Surgical History:  Procedure Laterality Date   CERVICAL CONIZATION W/BX  01/28/2009   DILATION AND CURETTAGE OF UTERUS  01/28/2009   LAPAROSCOPIC TUBAL LIGATION Bilateral 06/12/2017   Procedure: LAPAROSCOPIC TUBAL  LIGATION WITH FILSHIE CLIPS;  Surgeon: Donnamae Jude, MD;  Location: [facility];  Service: Gynecology;  Laterality: Bilateral;   LEEP  2010   PILONIDAL CYST EXCISION  08/27/2008   TONSILLECTOMY  01/11/2006     OB History    Gravida  3   Para  3   Term  1   Preterm  2   AB  0   Living  3     SAB  0   TAB  0   Ectopic  0   Multiple  0   Live Births  3            Home Medications    Prior to Admission medications   Medication Sig Start Date End Date Taking? Authorizing Provider  HYDROcodone-acetaminophen (NORCO/VICODIN) 5-325 MG tablet Take 1 tablet by mouth 2 (two) times daily as needed for moderate pain.    Yes [provider]  methylphenidate (RITALIN) 20 MG tablet Take 3 tablets (60 mg total) by mouth daily. Patient taking differently: Take 20 mg by mouth 3 (three) times daily.  01/30/17  Yes Aletha Halim, MD  Multiple Vitamin (MULTIVITAMIN) tablet Take 1 tablet by mouth daily.   Yes [provider]  traMADol (ULTRAM) 50 MG tablet Take 100 mg by mouth 3 (three) times daily.   Yes [provider]  metroNIDAZOLE (FLAGYL) 500 MG tablet Take 1 tablet (500 mg total) by mouth 2 (two) times daily. 10/31/18   Eustaquio Maize, PA-C    Family History Family History  Problem Relation Age of Onset  Heart disease Father    Diabetes Maternal Grandmother     Social History Social History   Tobacco Use   Smoking status: Former Smoker    Quit date: 05/20/2016    Years since quitting: 2.4   Smokeless tobacco: Never Used  Substance Use Topics   Alcohol use: Yes    Comment: occasionally   Drug use: No     Allergies   Nsaids and Adhesive [tape]   Review of Systems Review of Systems  Constitutional: Negative for chills and fever.  HENT: Negative for congestion.   Eyes: Negative for redness.  Respiratory: Negative for cough and shortness of breath.   Cardiovascular: Negative for chest pain.    Gastrointestinal: Positive for abdominal pain. Negative for constipation, diarrhea, nausea and vomiting.  Genitourinary: Positive for frequency and vaginal discharge. Negative for dysuria, hematuria, menstrual problem, pelvic pain, urgency, vaginal bleeding and vaginal pain.  Musculoskeletal: Negative for myalgias.  Skin: Negative for rash.  Neurological: Negative for headaches.     Physical Exam Updated Vital Signs BP 108/80    Pulse 68    Temp 98.7 F (37.1 C) (Oral)    Resp 18    Ht 5\' 2"  (1.575 m)    Wt 74.4 kg    LMP 10/05/2018    SpO2 100%    BMI 30.00 kg/m   Physical Exam Vitals signs and nursing note reviewed.  Constitutional:      Appearance: She is not ill-appearing.  HENT:     Head: Normocephalic and atraumatic.  Eyes:     Conjunctiva/sclera: Conjunctivae normal.  Neck:     Musculoskeletal: Neck supple.  Cardiovascular:     Rate and Rhythm: Normal rate and regular rhythm.  Pulmonary:     Effort: Pulmonary effort is normal.     Breath sounds: Normal breath sounds. No wheezing, rhonchi or rales.  Abdominal:     General: Abdomen is flat.     Palpations: Abdomen is soft.     Tenderness: There is abdominal tenderness.     Comments: Tenderness to RUQ and RLQ with palpation; voluntary guarding; no rebound tenderness  Genitourinary:    Comments: Chaperone present for exam. No rashes, lesions, or tenderness to external genitalia. No erythema, injury, or tenderness to vaginal mucosa. No  vaginal discharge or bleeding within vaginal vault. No  adnexal masses, tenderness, or fullness. No  CMT, cervical friability, or discharge from cervical os. Cervical os is closed.   Skin:    General: Skin is warm and dry.  Neurological:     Mental Status: She is alert.      ED Treatments / Results  Labs (all labs ordered are listed, but only abnormal results are displayed) Labs Reviewed  WET PREP, GENITAL - Abnormal; Notable for the following components:      Result Value    Clue Cells Wet Prep HPF POC PRESENT (*)    WBC, Wet Prep HPF POC FEW (*)    All other components within normal limits  COMPREHENSIVE METABOLIC PANEL - Abnormal; Notable for the following components:   Potassium 3.4 (*)    Total Protein 8.4 (*)    Total Bilirubin 0.2 (*)    All other components within normal limits  CBC - Abnormal; Notable for the following components:   WBC 12.5 (*)    All other components within normal limits  URINALYSIS, ROUTINE W REFLEX MICROSCOPIC - Abnormal; Notable for the following components:   Color, Urine STRAW (*)    Specific Gravity,  Urine 1.004 (*)    Hgb urine dipstick SMALL (*)    All other components within normal limits  LIPASE, BLOOD  HIV ANTIBODY (ROUTINE TESTING W REFLEX)  RPR  I-STAT BETA HCG BLOOD, ED ([facility], [facility], [facility] ONLY)  GC/CHLAMYDIA PROBE AMP ([place]    EKG None  Radiology [place] Transvaginal Non-ob  Result Date: 10/30/2018 CLINICAL DATA:  Initial evaluation for acute right lower quadrant pain for several months. EXAM: TRANSABDOMINAL AND TRANSVAGINAL ULTRASOUND OF PELVIS DOPPLER ULTRASOUND OF OVARIES TECHNIQUE: Both transabdominal and transvaginal ultrasound examinations of the pelvis were performed. Transabdominal technique was performed for global imaging of the pelvis including uterus, ovaries, adnexal regions, and pelvic cul-de-sac. It was necessary to proceed with endovaginal exam following the transabdominal exam to visualize the uterus, endometrium, and ovaries. Color and duplex Doppler ultrasound was utilized to evaluate blood flow to the ovaries. COMPARISON:  Prior ultrasound from 02/07/2017. FINDINGS: Uterus Measurements: 8.2 x 3.7 x 4.4 cm = volume: 60.9 mL. No fibroids or other mass visualized. Endometrium Thickness: 6.2 mm.  No focal abnormality visualized. Right ovary Measurements: 3.7 x 2.1 x 1.8 cm = volume: 2.3 mL. Normal appearance/no adnexal mass. Left ovary Measurements: 2.8 x 1.3 x 1.3 cm = volume: 2.5 mL. Normal  appearance/no adnexal mass. Pulsed Doppler evaluation of both ovaries demonstrates normal low-resistance arterial and venous waveforms. Other findings Trace free physiologic fluid present within the pelvis. IMPRESSION: Normal pelvic ultrasound. No evidence for torsion or other acute abnormality. Electronically Signed   By: Rise MuBenjamin  McClintock M.D.   On: 10/30/2018 22:48   [place] Pelvis Complete  Result Date: 10/30/2018 CLINICAL DATA:  Initial evaluation for acute right lower quadrant pain for several months. EXAM: TRANSABDOMINAL AND TRANSVAGINAL ULTRASOUND OF PELVIS DOPPLER ULTRASOUND OF OVARIES TECHNIQUE: Both transabdominal and transvaginal ultrasound examinations of the pelvis were performed. Transabdominal technique was performed for global imaging of the pelvis including uterus, ovaries, adnexal regions, and pelvic cul-de-sac. It was necessary to proceed with endovaginal exam following the transabdominal exam to visualize the uterus, endometrium, and ovaries. Color and duplex Doppler ultrasound was utilized to evaluate blood flow to the ovaries. COMPARISON:  Prior ultrasound from 02/07/2017. FINDINGS: Uterus Measurements: 8.2 x 3.7 x 4.4 cm = volume: 60.9 mL. No fibroids or other mass visualized. Endometrium Thickness: 6.2 mm.  No focal abnormality visualized. Right ovary Measurements: 3.7 x 2.1 x 1.8 cm = volume: 2.3 mL. Normal appearance/no adnexal mass. Left ovary Measurements: 2.8 x 1.3 x 1.3 cm = volume: 2.5 mL. Normal appearance/no adnexal mass. Pulsed Doppler evaluation of both ovaries demonstrates normal low-resistance arterial and venous waveforms. Other findings Trace free physiologic fluid present within the pelvis. IMPRESSION: Normal pelvic ultrasound. No evidence for torsion or other acute abnormality. Electronically Signed   By: Rise MuBenjamin  McClintock M.D.   On: 10/30/2018 22:48   Ct Abdomen Pelvis W Contrast  Result Date: 10/31/2018 CLINICAL DATA:  33 year old female with right lower quadrant  abdominal pain for 2 months. Microscopic hematuria. EXAM: CT ABDOMEN AND PELVIS WITH CONTRAST TECHNIQUE: Multidetector CT imaging of the abdomen and pelvis was performed using the standard protocol following bolus administration of intravenous contrast. CONTRAST:  100mL OMNIPAQUE IOHEXOL 300 MG/ML  SOLN COMPARISON:  Pelvis ultrasound earlier today. MRI abdomen and pelvis 10/19/2016. FINDINGS: Lower chest: Negative aside from mild dependent right lower lobe atelectasis. Hepatobiliary: Negative liver and gallbladder. Pancreas: Negative. Spleen: Negative. Adrenals/Urinary Tract: Normal adrenal glands. Bilateral renal enhancement is symmetric and normal. No nephrolithiasis identified. No hydronephrosis or  perinephric stranding. The visible right ureter is normal. Unremarkable urinary bladder. Stomach/Bowel: Negative descending and rectosigmoid colon aside from some retained stool. Similar retained stool in the transverse colon and right colon. No large bowel inflammation. Elongated and normal appendix visible on coronal image 55. The cecum is partially located in the pelvis. The terminal ileum appears normal. No dilated small bowel. Decompressed and negative stomach and duodenum. No free air. No abdominal free fluid. Vascular/Lymphatic: Major arterial structures appear patent and normal. Portal venous system appears grossly patent. No lymphadenopathy. Reproductive: Bilateral tubal ligation clips, within normal limits. Other: Trace pelvic free fluid in the cul-de-sac. Musculoskeletal: Negative. IMPRESSION: Normal appendix and negative CT Abdomen and Pelvis. Electronically Signed   By: Odessa FlemingH  Hall M.D.   On: 10/31/2018 00:36   [place] Art/ven Flow Abd Pelv Doppler  Result Date: 10/30/2018 CLINICAL DATA:  Initial evaluation for acute right lower quadrant pain for several months. EXAM: TRANSABDOMINAL AND TRANSVAGINAL ULTRASOUND OF PELVIS DOPPLER ULTRASOUND OF OVARIES TECHNIQUE: Both transabdominal and transvaginal ultrasound  examinations of the pelvis were performed. Transabdominal technique was performed for global imaging of the pelvis including uterus, ovaries, adnexal regions, and pelvic cul-de-sac. It was necessary to proceed with endovaginal exam following the transabdominal exam to visualize the uterus, endometrium, and ovaries. Color and duplex Doppler ultrasound was utilized to evaluate blood flow to the ovaries. COMPARISON:  Prior ultrasound from 02/07/2017. FINDINGS: Uterus Measurements: 8.2 x 3.7 x 4.4 cm = volume: 60.9 mL. No fibroids or other mass visualized. Endometrium Thickness: 6.2 mm.  No focal abnormality visualized. Right ovary Measurements: 3.7 x 2.1 x 1.8 cm = volume: 2.3 mL. Normal appearance/no adnexal mass. Left ovary Measurements: 2.8 x 1.3 x 1.3 cm = volume: 2.5 mL. Normal appearance/no adnexal mass. Pulsed Doppler evaluation of both ovaries demonstrates normal low-resistance arterial and venous waveforms. Other findings Trace free physiologic fluid present within the pelvis. IMPRESSION: Normal pelvic ultrasound. No evidence for torsion or other acute abnormality. Electronically Signed   By: Rise MuBenjamin  McClintock M.D.   On: 10/30/2018 22:48    Procedures Procedures (including critical care time)  Medications Ordered in ED Medications  sodium chloride (PF) 0.9 % injection (has no administration in time range)  sodium chloride flush (NS) 0.9 % injection 3 mL (3 mLs Intravenous Given 10/30/18 1917)  morphine 4 MG/ML injection 4 mg (4 mg Intravenous Given 10/30/18 2318)  iohexol (OMNIPAQUE) 300 MG/ML solution 100 mL (100 mLs Intravenous Contrast Given 10/31/18 0015)     Initial Impression / Assessment and Plan / ED Course  I have reviewed the triage vital signs and the nursing notes.  Pertinent labs & imaging results that were available during my care of the patient were reviewed by me and considered in my medical decision making (see chart for details).    Pt is a 33 year old female who presents  to the ED complaining of intermittent RLQ abdominal pain x months; most recent 2 days. Seen by PCP previously; had workup done which was negative. Labwork obtained in triage prior to patient being seen; mildly elevated leukocytosis at 12.5 although this appears to be patients baseline/could be elevated due to pain; mild hypokalemia at 3.4; creatinine within normal limits; all other electrolytes normal; lipase negative; neg beta hCG. Will obtain wet prep and GC/chlamydia today given complaint of vaginal discharge. Pelvic ultrasound initially ordered given recurrent similar pain in the past and concern for possible ovarian torsion. Morphine given for pain. PMDP reviewed.   Ultrasound negative at this time.  Pt still in a significant amount of pain; will proceed with CT A/P at this time to rule out appendicitis although low suspicion given patient has had this pain on and off for multiple months and no N/V/D or fevers. Wet prep with clue cells; will treat outpatient for BV.   CT A/P negative as well; unsure what is causing patient's pain. Will have her follow up with PCP outpatient as it appears they have been trying to work her up as well. Have also given her info for OBGYN. Patient is in agreement with plan at this time and stable for discharge home.        Final Clinical Impressions(s) / ED Diagnoses   Final diagnoses:  Bacterial vaginosis  Right lower quadrant abdominal pain    ED Discharge Orders         Ordered    metroNIDAZOLE (FLAGYL) 500 MG tablet  2 times daily     10/31/18 0102           Tanda Rockers, PA-C 10/31/18 0112    Mancel Bale, MD 10/31/18 2243

## 2018-10-30 NOTE — ED Notes (Signed)
Ultrasound bedside.

## 2018-10-30 NOTE — ED Notes (Signed)
Pelvic setup at bedside.

## 2018-10-30 NOTE — ED Notes (Signed)
Ultrasound finished 

## 2018-10-30 NOTE — ED Triage Notes (Signed)
Patient C/O right lower abdominal pain 8/10  Patient states she has been having a stabbing pain right lower abdomen for a couple of months. Patient went to PCP and had blood work and UA.   Patient states her WBC were slightly elevated but was not prescribed anything or scans.    Denies N/V or diarrhea  Urinary frequency at night.    A/Ox4 Ambulatory in triage.

## 2018-10-31 ENCOUNTER — Encounter (HOSPITAL_COMMUNITY): Payer: Self-pay

## 2018-10-31 LAB — GC/CHLAMYDIA PROBE AMP (~~LOC~~) NOT AT ARMC
Chlamydia: NEGATIVE
Neisseria Gonorrhea: NEGATIVE

## 2018-10-31 LAB — RPR: RPR Ser Ql: NONREACTIVE

## 2018-10-31 LAB — HIV ANTIBODY (ROUTINE TESTING W REFLEX): HIV Screen 4th Generation wRfx: NONREACTIVE

## 2018-10-31 MED ORDER — METRONIDAZOLE 500 MG PO TABS: 500.0000 mg | ORAL_TABLET | Freq: Two times a day (BID) | ORAL | 0 refills | Status: DC

## 2018-10-31 MED ORDER — IOHEXOL 300 MG/ML  SOLN
100.0000 mL | Freq: Once | INTRAMUSCULAR | Status: AC | PRN
  Administered 2018-10-31: 100 mL via INTRAVENOUS

## 2018-10-31 NOTE — Discharge Instructions (Signed)
You were seen in the ED today for right lower abdominal pain; your labwork, pelvic ultrasound, and CAT scan were essentially negative. You were found to have bacterial vaginosis; please take Flagyl as prescribed for the next week. IT is important to refrain from alcohol while you take this medicine. Please follow up with your PCP regarding your ED visit today. You may also follow up with OBGYN.

## 2019-03-30 ENCOUNTER — Other Ambulatory Visit: Payer: Self-pay

## 2019-03-30 ENCOUNTER — Emergency Department (HOSPITAL_BASED_OUTPATIENT_CLINIC_OR_DEPARTMENT_OTHER)
Admission: EM | Admit: 2019-03-30 | Discharge: 2019-03-30 | Disposition: A | Discharge status: Home / Self Care | Payer: PRIVATE HEALTH INSURANCE | Attending: Emergency Medicine | Admitting: Emergency Medicine

## 2019-03-30 ENCOUNTER — Emergency Department (HOSPITAL_BASED_OUTPATIENT_CLINIC_OR_DEPARTMENT_OTHER): Payer: PRIVATE HEALTH INSURANCE

## 2019-03-30 ENCOUNTER — Encounter (HOSPITAL_BASED_OUTPATIENT_CLINIC_OR_DEPARTMENT_OTHER): Payer: Self-pay

## 2019-03-30 DIAGNOSIS — Z79899 Other long term (current) drug therapy: Secondary | ICD-10-CM | POA: Diagnosis not present

## 2019-03-30 DIAGNOSIS — R233 Spontaneous ecchymoses: Secondary | ICD-10-CM | POA: Diagnosis not present

## 2019-03-30 DIAGNOSIS — Z91048 Other nonmedicinal substance allergy status: Secondary | ICD-10-CM | POA: Insufficient documentation

## 2019-03-30 DIAGNOSIS — Z87891 Personal history of nicotine dependence: Secondary | ICD-10-CM | POA: Diagnosis not present

## 2019-03-30 DIAGNOSIS — Z886 Allergy status to analgesic agent status: Secondary | ICD-10-CM | POA: Diagnosis not present

## 2019-03-30 DIAGNOSIS — R1084 Generalized abdominal pain: Secondary | ICD-10-CM | POA: Diagnosis present

## 2019-03-30 DIAGNOSIS — R58 Hemorrhage, not elsewhere classified: Secondary | ICD-10-CM

## 2019-03-30 LAB — CBC WITH DIFFERENTIAL/PLATELET
Abs Immature Granulocytes: 0.03 10*3/uL (ref 0.00–0.07)
Basophils Absolute: 0.1 10*3/uL (ref 0.0–0.1)
Basophils Relative: 1 %
Eosinophils Absolute: 0.1 10*3/uL (ref 0.0–0.5)
Eosinophils Relative: 1 %
HCT: 39.9 % (ref 36.0–46.0)
Hemoglobin: 13.2 g/dL (ref 12.0–15.0)
Immature Granulocytes: 0 %
Lymphocytes Relative: 34 %
Lymphs Abs: 3.2 10*3/uL (ref 0.7–4.0)
MCH: 30.5 pg (ref 26.0–34.0)
MCHC: 33.1 g/dL (ref 30.0–36.0)
MCV: 92.1 fL (ref 80.0–100.0)
Monocytes Absolute: 0.7 10*3/uL (ref 0.1–1.0)
Monocytes Relative: 7 %
Neutro Abs: 5.4 10*3/uL (ref 1.7–7.7)
Neutrophils Relative %: 57 %
Platelets: 242 10*3/uL (ref 150–400)
RBC: 4.33 MIL/uL (ref 3.87–5.11)
RDW: 12.3 % (ref 11.5–15.5)
WBC: 9.5 10*3/uL (ref 4.0–10.5)
nRBC: 0 % (ref 0.0–0.2)

## 2019-03-30 LAB — COMPREHENSIVE METABOLIC PANEL
ALT: 23 U/L (ref 0–44)
AST: 22 U/L (ref 15–41)
Albumin: 4.1 g/dL (ref 3.5–5.0)
Alkaline Phosphatase: 60 U/L (ref 38–126)
Anion gap: 9 (ref 5–15)
BUN: 5 mg/dL — ABNORMAL LOW (ref 6–20)
CO2: 22 mmol/L (ref 22–32)
Calcium: 8.9 mg/dL (ref 8.9–10.3)
Chloride: 107 mmol/L (ref 98–111)
Creatinine, Ser: 0.46 mg/dL (ref 0.44–1.00)
GFR calc Af Amer: >60 mL/min (ref >60)
GFR calc non Af Amer: >60 mL/min (ref >60)
Glucose, Bld: 92 mg/dL (ref 70–99)
Potassium: 3.8 mmol/L (ref 3.5–5.1)
Sodium: 138 mmol/L (ref 135–145)
Total Bilirubin: 0.6 mg/dL (ref 0.3–1.2)
Total Protein: 7.1 g/dL (ref 6.5–8.1)

## 2019-03-30 LAB — LIPASE, BLOOD: Lipase: 27 U/L (ref 11–51)

## 2019-03-30 LAB — URINALYSIS, ROUTINE W REFLEX MICROSCOPIC
Bilirubin Urine: NEGATIVE
Glucose, UA: NEGATIVE mg/dL
Ketones, ur: NEGATIVE mg/dL
Leukocytes,Ua: NEGATIVE
Nitrite: NEGATIVE
Protein, ur: NEGATIVE mg/dL
Specific Gravity, Urine: <1.005 — ABNORMAL LOW (ref 1.005–1.030)
pH: 7 (ref 5.0–8.0)

## 2019-03-30 LAB — PREGNANCY, URINE: Preg Test, Ur: NEGATIVE

## 2019-03-30 LAB — URINALYSIS, MICROSCOPIC (REFLEX): WBC, UA: NONE SEEN WBC/hpf (ref 0–5)

## 2019-03-30 MED ORDER — IOHEXOL 300 MG/ML  SOLN
100.0000 mL | Freq: Once | INTRAMUSCULAR | Status: AC | PRN
  Administered 2019-03-30: 100 mL via INTRAVENOUS

## 2019-03-30 NOTE — ED Notes (Signed)
Patient transported to CT 

## 2019-03-30 NOTE — ED Triage Notes (Addendum)
Pt c/o lower abd pain x 2 days-noticed bruising to lower abd today-denies injury-sent from PCP-NAD-steady gait-bruising noted to bilat abd areas

## 2019-03-30 NOTE — ED Provider Notes (Signed)
[facility] EMERGENCY DEPARTMENT Provider Note   CSN: [id number] Arrival date & time: 03/30/19  1741     History   Chief Complaint Chief Complaint  Patient presents with   Abdominal Pain    HPI Gloria Weber is a 33 y.o. female.     Patient is a 33 year old female past medical history of ADD presenting to the emergency department for bruising to her belly.  Patient reports that she started to feel lower abdominal pain which is consistent with her abdominal pain that she usually gets before her menstrual cycle starts.  She is due to start her menstrual cycle in a couple of days.  Reports that she came to be evaluated because this morning when she looked at her belly she saw some bruising.  Reports that her skin is tender over the bruising.  She denies any known injury or trauma but does report that she is doing a lot of cleaning and physical activity yesterday.     Past Medical History:  Diagnosis Date   ADD (attention deficit disorder)    Sciatica     Patient Active Problem List   Diagnosis Date Noted   Encounter for sterilization 06/06/2017   Vaginal discharge 12/20/2016   Unwanted fertility 12/20/2016   Adult ADHD 11/22/2016   Opioid dependence ([place]) 11/22/2016   Drug dependence affecting pregnancy, antepartum 11/22/2016   Sciatica    Abnormal Pap smear of cervix 10/30/2016   History of LEEP (loop electrosurgical excision procedure) of cervix complicating pregnancy, unspecified trimester 10/25/2016    Past Surgical History:  Procedure Laterality Date   CERVICAL CONIZATION W/BX  01/28/2009   DILATION AND CURETTAGE OF UTERUS  01/28/2009   LAPAROSCOPIC TUBAL LIGATION Bilateral 06/12/2017   Procedure: LAPAROSCOPIC TUBAL LIGATION WITH FILSHIE CLIPS;  Surgeon: Donnamae Jude, MD;  Location: [place];  Service: Gynecology;  Laterality: Bilateral;   LEEP  2010   PILONIDAL CYST EXCISION  08/27/2008   TONSILLECTOMY   01/11/2006     OB History    Gravida  3   Para  3   Term  1   Preterm  2   AB  0   Living  3     SAB  0   TAB  0   Ectopic  0   Multiple  0   Live Births  3            Home Medications    Prior to Admission medications   Medication Sig Start Date End Date Taking? Authorizing Provider  HYDROcodone-acetaminophen (NORCO/VICODIN) 5-325 MG tablet Take 1 tablet by mouth 2 (two) times daily as needed for moderate pain.     [provider]  methylphenidate (RITALIN) 20 MG tablet Take 3 tablets (60 mg total) by mouth daily. Patient taking differently: Take 20 mg by mouth 3 (three) times daily.  01/30/17   Aletha Halim, MD  metroNIDAZOLE (FLAGYL) 500 MG tablet Take 1 tablet (500 mg total) by mouth 2 (two) times daily. 10/31/18   Eustaquio Maize, PA-C  Multiple Vitamin (MULTIVITAMIN) tablet Take 1 tablet by mouth daily.    [provider]  traMADol (ULTRAM) 50 MG tablet Take 100 mg by mouth 3 (three) times daily.    [provider]    Family History Family History  Problem Relation Age of Onset   Heart disease Father    Diabetes Maternal Grandmother     Social History Social History   Tobacco Use   Smoking  status: Former Smoker    Quit date: 05/20/2016    Years since quitting: 2.8   Smokeless tobacco: Never Used  Substance Use Topics   Alcohol use: Yes    Comment: occasionally   Drug use: No     Allergies   Nsaids and Adhesive [tape]   Review of Systems Review of Systems  Constitutional: Negative for activity change, appetite change, chills and fever.  HENT: Negative for ear pain and sore throat.   Eyes: Negative for pain and visual disturbance.  Respiratory: Negative for cough and shortness of breath.   Cardiovascular: Negative for chest pain and palpitations.  Gastrointestinal: Positive for abdominal pain. Negative for abdominal distention, blood in stool, constipation, diarrhea, nausea, rectal pain and vomiting.    Genitourinary: Negative for decreased urine volume, difficulty urinating, dysuria, flank pain, frequency, hematuria, menstrual problem, vaginal bleeding, vaginal discharge and vaginal pain.  Musculoskeletal: Negative for arthralgias, back pain and joint swelling.  Skin: Positive for color change. Negative for rash and wound.  Neurological: Negative for seizures and syncope.  All other systems reviewed and are negative.    Physical Exam Updated Vital Signs BP 112/74 (BP Location: Left Arm)    Pulse 77    Temp 98.6 F (37 C) (Oral)    Resp 20    LMP 03/03/2019    SpO2 100%   Physical Exam Vitals signs and nursing note reviewed.  Constitutional:      Appearance: Normal appearance.  HENT:     Head: Normocephalic.  Eyes:     Conjunctiva/sclera: Conjunctivae normal.  Cardiovascular:     Rate and Rhythm: Normal rate and regular rhythm.  Pulmonary:     Effort: Pulmonary effort is normal.  Abdominal:     General: Abdomen is flat. Bowel sounds are normal.     Palpations: Abdomen is soft.     Tenderness: There is abdominal tenderness. There is no right CVA tenderness, left CVA tenderness, guarding or rebound. Negative signs include Murphy's sign, Rovsing's sign, McBurney's sign and psoas sign.       Comments: Bilateral bruising to the lower quadrants with tenderness over the skin.  Skin:    General: Skin is dry.  Neurological:     Mental Status: She is alert.  Psychiatric:        Mood and Affect: Mood normal.      ED Treatments / Results  Labs (all labs ordered are listed, but only abnormal results are displayed) Labs Reviewed  URINALYSIS, ROUTINE W REFLEX MICROSCOPIC - Abnormal; Notable for the following components:      Result Value   APPearance HAZY (*)    Specific Gravity, Urine <1.005 (*)    Hgb urine dipstick TRACE (*)    All other components within normal limits  URINALYSIS, MICROSCOPIC (REFLEX) - Abnormal; Notable for the following components:   Bacteria, UA RARE  (*)    All other components within normal limits  COMPREHENSIVE METABOLIC PANEL - Abnormal; Notable for the following components:   BUN 5 (*)    All other components within normal limits  PREGNANCY, URINE  CBC WITH DIFFERENTIAL/PLATELET  LIPASE, BLOOD    EKG None  Radiology Ct Abdomen Pelvis W Contrast  Result Date: 03/30/2019 CLINICAL DATA:  Lower abdominal pain for 2 days EXAM: CT ABDOMEN AND PELVIS WITH CONTRAST TECHNIQUE: Multidetector CT imaging of the abdomen and pelvis was performed using the standard protocol following bolus administration of intravenous contrast. CONTRAST:  OMNIPAQUE IOHEXOL 300 MG/ML  SOLN COMPARISON:  10/31/2018 FINDINGS: Lower chest: No acute abnormality. Hepatobiliary: No focal liver abnormality is seen. No gallstones, gallbladder wall thickening, or biliary dilatation. Pancreas: Unremarkable. No pancreatic ductal dilatation or surrounding inflammatory changes. Spleen: Normal in size without focal abnormality. Adrenals/Urinary Tract: Adrenal glands are unremarkable. Kidneys are normal, without renal calculi, focal lesion, or hydronephrosis. Bladder is unremarkable. Stomach/Bowel: Stomach is within normal limits. Appendix appears normal (series 5, images 41-45). No evidence of bowel wall thickening, distention, or inflammatory changes. Vascular/Lymphatic: No significant vascular findings are present. No enlarged abdominal or pelvic lymph nodes. Reproductive: Uterus and bilateral adnexa within normal limits. Interval migration of right tubal ligation clip, which is now located within the posterior cul-de-sac on the right (series 2, image 62) where there is a small amount of adjacent free fluid. The left tubal ligation clip remains unchanged in positioning. Other: Small fat containing umbilical hernia. No pneumoperitoneum. Musculoskeletal: No acute or significant osseous findings. IMPRESSION: 1. Interval migration of right tubal ligation clip, which is now located  within the posterior cul-de-sac where there is a small amount of adjacent free fluid. The left tubal ligation clip is unchanged in positioning. 2. No acute bowel inflammation or obstruction. Normal appendix. 3. Small fat containing umbilical hernia. Electronically Signed   By: Duanne Guess M.D.   On: 03/30/2019 20:45    Procedures Procedures (including critical care time)  Medications Ordered in ED Medications  iohexol (OMNIPAQUE) 300 MG/ML solution 100 mL (100 mLs Intravenous Contrast Given 03/30/19 2028)     Initial Impression / Assessment and Plan / ED Course  I have reviewed the triage vital signs and the nursing notes.  Pertinent labs & imaging results that were available during my care of the patient were reviewed by me and considered in my medical decision making (see chart for details).  Clinical Course as of Mar 29 2141  Mon Mar 30, 2019  [id number] 33 year old female presenting with bruising to her belly with abdominal pain.  She has tenderness to the skin and ecchymosis but no significant abdominal pain.  Labs are reassuring.  CT scan showing no acute intra-abdominal process but that one of her tubal ligation clips had migrated.  This was discussed with the patient.  Patient was also discussed with Dr. Lynelle Doctor and the plan was agreed upon.   [KM]    Clinical Course User Index [KM] Arlyn Dunning, PA-C       Based on review of vitals, medical screening exam, lab work and/or imaging, there does not appear to be an acute, emergent etiology for the patient's symptoms. Counseled pt on good return precautions and encouraged both PCP and ED follow-up as needed.  Prior to discharge, I also discussed incidental imaging findings with patient in detail and advised appropriate, recommended follow-up in detail.  Clinical Impression: 1. Generalized abdominal pain   2. Ecchymosis     Disposition: Discharge  Prior to providing a prescription for a controlled substance, I independently  reviewed the patient's recent prescription history on the [place] Controlled Substance Reporting System. The patient had no recent or regular prescriptions and was deemed appropriate for a brief, less than 3 day prescription of narcotic for acute analgesia.  This note was prepared with assistance of Conservation officer, historic buildings. Occasional wrong-word or sound-a-like substitutions may have occurred due to the inherent limitations of voice recognition software.   Final Clinical Impressions(s) / ED Diagnoses   Final diagnoses:  Generalized abdominal pain  Ecchymosis    ED Discharge Orders  None       Jeral PinchMcLean, Hollan Philipp A, PA-C 03/30/19 2142    Linwood DibblesKnapp, Jon, MD 03/30/19 [phone]

## 2019-03-30 NOTE — Discharge Instructions (Addendum)
Please apply ice to the area of bruising and swelling for 20 minutes at a time, 3 times a day.  Take ibuprofen 600 or 800 mg every 8 hours as well.  Follow-up with your primary care doctor. Thank you for allowing me to care for you today. Please return to the emergency department if you have new or worsening symptoms

## 2019-04-28 ENCOUNTER — Other Ambulatory Visit: Payer: Self-pay | Admitting: Obstetrics & Gynecology

## 2019-05-07 ENCOUNTER — Other Ambulatory Visit: Payer: Self-pay

## 2019-05-07 ENCOUNTER — Other Ambulatory Visit (HOSPITAL_COMMUNITY)
Admission: RE | Admit: 2019-05-07 | Discharge: 2019-05-07 | Disposition: A | Discharge status: Home / Self Care | Payer: PRIVATE HEALTH INSURANCE | Source: Ambulatory Visit | Attending: Obstetrics & Gynecology | Admitting: Obstetrics & Gynecology

## 2019-05-07 ENCOUNTER — Encounter (HOSPITAL_BASED_OUTPATIENT_CLINIC_OR_DEPARTMENT_OTHER): Payer: Self-pay | Admitting: Obstetrics & Gynecology

## 2019-05-07 DIAGNOSIS — Z01812 Encounter for preprocedural laboratory examination: Secondary | ICD-10-CM | POA: Diagnosis not present

## 2019-05-07 DIAGNOSIS — Z20828 Contact with and (suspected) exposure to other viral communicable diseases: Secondary | ICD-10-CM | POA: Insufficient documentation

## 2019-05-07 NOTE — Progress Notes (Signed)
Spoke w/ via phone for pre-op interview---[name] needs dos----    CBC URINE PREG          Lab results------ COVID test ------05-07-19 Arrive at -------1130 NPO after ------MIDNIGHT FOOD, CLEAR LIQUIDS UNTIL 730 AM THEN NPO Medications to take morning of surgery -----TRAMADOL, HYDROCODONE PRN Diabetic medication -----N/A Patient Special Instructions ----- Pre-Op special Istructions ----- Patient verbalized understanding of instructions that were given at this phone interview. Patient denies shortness of breath, chest pain, fever, cough a this phone interview.

## 2019-05-08 LAB — NOVEL CORONAVIRUS, NAA (HOSP ORDER, SEND-OUT TO REF LAB; TAT 18-24 HRS): SARS-CoV-2, NAA: NOT DETECTED

## 2019-05-10 NOTE — Anesthesia Preprocedure Evaluation (Addendum)
Anesthesia Evaluation  Patient identified by MRN, date of birth, ID band Patient awake    Reviewed: Allergy & Precautions, NPO status , Patient's Chart, lab work & pertinent test results  Airway Mallampati: II  TM Distance: >3 FB Neck ROM: Full    Dental no notable dental hx. (+) Teeth Intact   Pulmonary neg pulmonary ROS, former smoker,    Pulmonary exam normal breath sounds clear to auscultation       Cardiovascular Exercise Tolerance: Good negative cardio ROS Normal cardiovascular exam Rhythm:Regular Rate:Normal     Neuro/Psych negative psych ROS   GI/Hepatic negative GI ROS, Neg liver ROS,   Endo/Other  negative endocrine ROS  Renal/GU      Musculoskeletal negative musculoskeletal ROS (+)   Abdominal   Peds  Hematology   Anesthesia Other Findings   Reproductive/Obstetrics                            Anesthesia Physical Anesthesia Plan  ASA: II  Anesthesia Plan: General   Post-op Pain Management:    Induction: Intravenous  PONV Risk Score and Plan: Treatment may vary due to age or medical condition, Ondansetron, Dexamethasone and Midazolam  Airway Management Planned: Oral ETT  Additional Equipment: None  Intra-op Plan:   Post-operative Plan: Extubation in OR  Informed Consent: I have reviewed the patients History and Physical, chart, labs and discussed the procedure including the risks, benefits and alternatives for the proposed anesthesia with the patient or authorized representative who has indicated his/her understanding and acceptance.     Dental advisory given  Plan Discussed with:   Anesthesia Plan Comments: (GA + 0.3 mcg/kg dexmedetomidine)       Anesthesia Quick Evaluation

## 2019-05-11 ENCOUNTER — Encounter (HOSPITAL_BASED_OUTPATIENT_CLINIC_OR_DEPARTMENT_OTHER)
Admission: RE | Disposition: A | Discharge status: Home / Self Care | Payer: Self-pay | Source: Home / Self Care | Attending: Obstetrics & Gynecology

## 2019-05-11 ENCOUNTER — Ambulatory Visit (HOSPITAL_BASED_OUTPATIENT_CLINIC_OR_DEPARTMENT_OTHER)
Admission: RE | Admit: 2019-05-11 | Discharge: 2019-05-11 | Disposition: A | Discharge status: Home / Self Care | Payer: PRIVATE HEALTH INSURANCE | Attending: Obstetrics & Gynecology | Admitting: Obstetrics & Gynecology

## 2019-05-11 ENCOUNTER — Other Ambulatory Visit: Payer: Self-pay

## 2019-05-11 ENCOUNTER — Encounter (HOSPITAL_BASED_OUTPATIENT_CLINIC_OR_DEPARTMENT_OTHER): Payer: Self-pay | Admitting: Obstetrics & Gynecology

## 2019-05-11 ENCOUNTER — Ambulatory Visit (HOSPITAL_BASED_OUTPATIENT_CLINIC_OR_DEPARTMENT_OTHER): Payer: PRIVATE HEALTH INSURANCE | Admitting: Anesthesiology

## 2019-05-11 DIAGNOSIS — Y838 Other surgical procedures as the cause of abnormal reaction of the patient, or of later complication, without mention of misadventure at the time of the procedure: Secondary | ICD-10-CM | POA: Diagnosis not present

## 2019-05-11 DIAGNOSIS — Z79891 Long term (current) use of opiate analgesic: Secondary | ICD-10-CM | POA: Diagnosis not present

## 2019-05-11 DIAGNOSIS — Z886 Allergy status to analgesic agent status: Secondary | ICD-10-CM | POA: Insufficient documentation

## 2019-05-11 DIAGNOSIS — N803 Endometriosis of pelvic peritoneum: Secondary | ICD-10-CM | POA: Insufficient documentation

## 2019-05-11 DIAGNOSIS — Z87891 Personal history of nicotine dependence: Secondary | ICD-10-CM | POA: Diagnosis not present

## 2019-05-11 DIAGNOSIS — F988 Other specified behavioral and emotional disorders with onset usually occurring in childhood and adolescence: Secondary | ICD-10-CM | POA: Insufficient documentation

## 2019-05-11 DIAGNOSIS — G8929 Other chronic pain: Secondary | ICD-10-CM | POA: Insufficient documentation

## 2019-05-11 DIAGNOSIS — T83428A Displacement of other prosthetic devices, implants and grafts of genital tract, initial encounter: Secondary | ICD-10-CM | POA: Diagnosis not present

## 2019-05-11 DIAGNOSIS — R102 Pelvic and perineal pain: Secondary | ICD-10-CM | POA: Insufficient documentation

## 2019-05-11 DIAGNOSIS — Z302 Encounter for sterilization: Secondary | ICD-10-CM | POA: Insufficient documentation

## 2019-05-11 HISTORY — DX: Pelvic and perineal pain: R10.2

## 2019-05-11 HISTORY — PX: LAPAROSCOPIC BILATERAL SALPINGECTOMY: SHX5889

## 2019-05-11 LAB — CBC
HCT: 41.8 % (ref 36.0–46.0)
Hemoglobin: 13.4 g/dL (ref 12.0–15.0)
MCH: 30.2 pg (ref 26.0–34.0)
MCHC: 32.1 g/dL (ref 30.0–36.0)
MCV: 94.4 fL (ref 80.0–100.0)
Platelets: 254 10*3/uL (ref 150–400)
RBC: 4.43 MIL/uL (ref 3.87–5.11)
RDW: 12.4 % (ref 11.5–15.5)
WBC: 7.9 10*3/uL (ref 4.0–10.5)
nRBC: 0 % (ref 0.0–0.2)

## 2019-05-11 LAB — POCT PREGNANCY, URINE: Preg Test, Ur: NEGATIVE

## 2019-05-11 SURGERY — SALPINGECTOMY, BILATERAL, LAPAROSCOPIC
Anesthesia: General | Site: Abdomen | Laterality: Bilateral

## 2019-05-11 MED ORDER — OXYCODONE HCL 5 MG PO TABS
5.0000 mg | ORAL_TABLET | Freq: Once | ORAL | Status: AC | PRN
  Administered 2019-05-11: 5 mg via ORAL
  Filled 2019-05-11: qty 1

## 2019-05-11 MED ORDER — KETOROLAC TROMETHAMINE 30 MG/ML IJ SOLN
INTRAMUSCULAR | Status: AC
  Filled 2019-05-11: qty 1

## 2019-05-11 MED ORDER — LACTATED RINGERS IV SOLN
INTRAVENOUS | Status: DC
  Filled 2019-05-11: qty 1000

## 2019-05-11 MED ORDER — OXYCODONE HCL 5 MG/5ML PO SOLN
5.0000 mg | Freq: Once | ORAL | Status: AC | PRN
  Filled 2019-05-11: qty 5

## 2019-05-11 MED ORDER — LIDOCAINE HCL (CARDIAC) PF 100 MG/5ML IV SOSY
PREFILLED_SYRINGE | INTRAVENOUS | Status: DC | PRN
  Administered 2019-05-11: 100 mg via INTRAVENOUS

## 2019-05-11 MED ORDER — PROPOFOL 10 MG/ML IV BOLUS
INTRAVENOUS | Status: AC
  Filled 2019-05-11: qty 20

## 2019-05-11 MED ORDER — FENTANYL CITRATE (PF) 100 MCG/2ML IJ SOLN
INTRAMUSCULAR | Status: DC | PRN
  Administered 2019-05-11: 25 ug via INTRAVENOUS
  Administered 2019-05-11: 100 ug via INTRAVENOUS
  Administered 2019-05-11 (×3): 50 ug via INTRAVENOUS
  Administered 2019-05-11: 25 ug via INTRAVENOUS

## 2019-05-11 MED ORDER — ONDANSETRON HCL 4 MG/2ML IJ SOLN
INTRAMUSCULAR | Status: AC
  Filled 2019-05-11: qty 2

## 2019-05-11 MED ORDER — DEXAMETHASONE SODIUM PHOSPHATE 10 MG/ML IJ SOLN
INTRAMUSCULAR | Status: AC
  Filled 2019-05-11: qty 1

## 2019-05-11 MED ORDER — HYDROMORPHONE HCL 1 MG/ML IJ SOLN
0.2500 mg | INTRAMUSCULAR | Status: DC | PRN
  Administered 2019-05-11 (×2): 0.5 mg via INTRAVENOUS
  Filled 2019-05-11: qty 0.5

## 2019-05-11 MED ORDER — OXYCODONE HCL 5 MG PO TABS
ORAL_TABLET | ORAL | Status: AC
  Filled 2019-05-11: qty 1

## 2019-05-11 MED ORDER — FENTANYL CITRATE (PF) 100 MCG/2ML IJ SOLN
INTRAMUSCULAR | Status: AC
  Filled 2019-05-11: qty 2

## 2019-05-11 MED ORDER — ROCURONIUM BROMIDE 100 MG/10ML IV SOLN
INTRAVENOUS | Status: DC | PRN
  Administered 2019-05-11: 60 mg via INTRAVENOUS
  Administered 2019-05-11: 10 mg via INTRAVENOUS

## 2019-05-11 MED ORDER — DEXMEDETOMIDINE HCL 200 MCG/2ML IV SOLN
INTRAVENOUS | Status: DC | PRN
  Administered 2019-05-11 (×2): 4 ug via INTRAVENOUS

## 2019-05-11 MED ORDER — KETOROLAC TROMETHAMINE 30 MG/ML IJ SOLN
INTRAMUSCULAR | Status: DC | PRN
  Administered 2019-05-11: 30 mg via INTRAVENOUS

## 2019-05-11 MED ORDER — MEPERIDINE HCL 25 MG/ML IJ SOLN
6.2500 mg | INTRAMUSCULAR | Status: DC | PRN
  Filled 2019-05-11: qty 1

## 2019-05-11 MED ORDER — MENTHOL 3 MG MT LOZG
LOZENGE | OROMUCOSAL | Status: AC
  Filled 2019-05-11: qty 9

## 2019-05-11 MED ORDER — BUPIVACAINE-EPINEPHRINE 0.25% -1:200000 IJ SOLN
INTRAMUSCULAR | Status: DC | PRN
  Administered 2019-05-11: 10 mL

## 2019-05-11 MED ORDER — MIDAZOLAM HCL 5 MG/5ML IJ SOLN
INTRAMUSCULAR | Status: DC | PRN
  Administered 2019-05-11: 2 mg via INTRAVENOUS

## 2019-05-11 MED ORDER — ACETAMINOPHEN 10 MG/ML IV SOLN
1000.0000 mg | Freq: Once | INTRAVENOUS | Status: DC | PRN
  Filled 2019-05-11: qty 100

## 2019-05-11 MED ORDER — ROCURONIUM BROMIDE 10 MG/ML (PF) SYRINGE
PREFILLED_SYRINGE | INTRAVENOUS | Status: AC
  Filled 2019-05-11: qty 10

## 2019-05-11 MED ORDER — MIDAZOLAM HCL 2 MG/2ML IJ SOLN
INTRAMUSCULAR | Status: AC
  Filled 2019-05-11: qty 2

## 2019-05-11 MED ORDER — ONDANSETRON HCL 4 MG/2ML IJ SOLN
INTRAMUSCULAR | Status: DC | PRN
  Administered 2019-05-11: 4 mg via INTRAVENOUS

## 2019-05-11 MED ORDER — LIDOCAINE 2% (20 MG/ML) 5 ML SYRINGE
INTRAMUSCULAR | Status: AC
  Filled 2019-05-11: qty 5

## 2019-05-11 MED ORDER — DEXAMETHASONE SODIUM PHOSPHATE 4 MG/ML IJ SOLN
INTRAMUSCULAR | Status: DC | PRN
  Administered 2019-05-11: 10 mg via INTRAVENOUS

## 2019-05-11 MED ORDER — SUGAMMADEX SODIUM 200 MG/2ML IV SOLN
INTRAVENOUS | Status: DC | PRN
  Administered 2019-05-11: 200 mg via INTRAVENOUS

## 2019-05-11 MED ORDER — HYDROMORPHONE HCL 1 MG/ML IJ SOLN
INTRAMUSCULAR | Status: AC
  Filled 2019-05-11: qty 1

## 2019-05-11 MED ORDER — PROPOFOL 10 MG/ML IV BOLUS
INTRAVENOUS | Status: DC | PRN
  Administered 2019-05-11: 150 mg via INTRAVENOUS

## 2019-05-11 MED ORDER — ONDANSETRON HCL 4 MG/2ML IJ SOLN
4.0000 mg | Freq: Once | INTRAMUSCULAR | Status: DC | PRN
  Filled 2019-05-11: qty 2

## 2019-05-11 MED ORDER — HYDROCODONE-ACETAMINOPHEN 5-325 MG PO TABS: ORAL_TABLET | ORAL | 0 refills | Status: DC

## 2019-05-11 SURGICAL SUPPLY — 47 items
ADH SKN CLS APL DERMABOND .7 (GAUZE/BANDAGES/DRESSINGS) ×1
APL SKNCLS STERI-STRIP NONHPOA (GAUZE/BANDAGES/DRESSINGS) ×1
APL SRG 38 LTWT LNG FL B (MISCELLANEOUS)
APPLICATOR ARISTA FLEXITIP XL (MISCELLANEOUS) IMPLANT
BAG RETRIEVAL 10MM (BASKET)
BENZOIN TINCTURE PRP APPL 2/3 (GAUZE/BANDAGES/DRESSINGS) ×3 IMPLANT
CABLE HIGH FREQUENCY MONO STRZ (ELECTRODE) ×4 IMPLANT
CLOSURE WOUND 1/2 X4 (GAUZE/BANDAGES/DRESSINGS) ×1
COVER MAYO STAND STRL (DRAPES) ×3 IMPLANT
COVER WAND RF STERILE (DRAPES) ×3 IMPLANT
DECANTER SPIKE VIAL GLASS SM (MISCELLANEOUS) ×3 IMPLANT
DERMABOND ADVANCED (GAUZE/BANDAGES/DRESSINGS) ×2
DERMABOND ADVANCED .7 DNX12 (GAUZE/BANDAGES/DRESSINGS) IMPLANT
DRSG OPSITE POSTOP 3X4 (GAUZE/BANDAGES/DRESSINGS) ×3 IMPLANT
DURAPREP 26ML APPLICATOR (WOUND CARE) ×3 IMPLANT
ELECT REM PT RETURN 9FT ADLT (ELECTROSURGICAL) ×3
ELECTRODE REM PT RTRN 9FT ADLT (ELECTROSURGICAL) ×1 IMPLANT
GAUZE 4X4 16PLY RFD (DISPOSABLE) ×3 IMPLANT
GLOVE BIOGEL PI IND STRL 7.0 (GLOVE) ×2 IMPLANT
GLOVE BIOGEL PI INDICATOR 7.0 (GLOVE) ×4
GLOVE SURG SS PI 6.5 STRL IVOR (GLOVE) ×3 IMPLANT
GOWN STRL REUS W/TWL LRG LVL3 (GOWN DISPOSABLE) ×9 IMPLANT
HEMOSTAT ARISTA ABSORB 3G PWDR (HEMOSTASIS) IMPLANT
LIGASURE VESSEL 5MM BLUNT TIP (ELECTROSURGICAL) ×3 IMPLANT
NDL HYPO 25X1 1.5 SAFETY (NEEDLE) IMPLANT
NEEDLE HYPO 25X1 1.5 SAFETY (NEEDLE) IMPLANT
NEEDLE INSUFFLATION 120MM (ENDOMECHANICALS) IMPLANT
NS IRRIG 500ML POUR BTL (IV SOLUTION) ×3 IMPLANT
PACK LAPAROSCOPY BASIN (CUSTOM PROCEDURE TRAY) ×3 IMPLANT
PAD OB MATERNITY 4.3X12.25 (PERSONAL CARE ITEMS) ×3 IMPLANT
PAD PREP 24X48 CUFFED NSTRL (MISCELLANEOUS) ×3 IMPLANT
SCISSORS LAP 5X35 DISP (ENDOMECHANICALS) IMPLANT
SET IRRIG TUBING LAPAROSCOPIC (IRRIGATION / IRRIGATOR) IMPLANT
SET TUBE SMOKE EVAC HIGH FLOW (TUBING) ×3 IMPLANT
SOLUTION ELECTROLUBE (MISCELLANEOUS) IMPLANT
STRIP CLOSURE SKIN 1/2X4 (GAUZE/BANDAGES/DRESSINGS) ×2 IMPLANT
SUT MNCRL AB 4-0 PS2 18 (SUTURE) ×3 IMPLANT
SUT VICRYL 0 UR6 27IN ABS (SUTURE) ×4 IMPLANT
SYR 50ML LL SCALE MARK (SYRINGE) ×2 IMPLANT
SYS BAG RETRIEVAL 10MM (BASKET)
SYSTEM BAG RETRIEVAL 10MM (BASKET) IMPLANT
TOWEL OR 17X26 10 PK STRL BLUE (TOWEL DISPOSABLE) ×6 IMPLANT
TRAY FOLEY W/BAG SLVR 14FR (SET/KITS/TRAYS/PACK) ×3 IMPLANT
TROCAR BALLN 12MMX100 BLUNT (TROCAR) ×3 IMPLANT
TROCAR XCEL NON-BLD 11X100MML (ENDOMECHANICALS) IMPLANT
TROCAR XCEL NON-BLD 5MMX100MML (ENDOMECHANICALS) ×6 IMPLANT
WARMER LAPAROSCOPE (MISCELLANEOUS) ×3 IMPLANT

## 2019-05-11 NOTE — Anesthesia Procedure Notes (Signed)
Procedure Name: Intubation Date/Time: 05/11/2019 1:50 PM Performed by: Justice Rocher, CRNA Pre-anesthesia Checklist: Patient identified, Emergency Drugs available, Suction available and Patient being monitored Patient Re-evaluated:Patient Re-evaluated prior to induction Oxygen Delivery Method: Circle system utilized Preoxygenation: Pre-oxygenation with 100% oxygen Induction Type: IV induction Ventilation: Mask ventilation without difficulty Laryngoscope Size: Mac and 3 Grade View: Grade II Tube type: Oral Tube size: 7.5 mm Number of attempts: 1 Airway Equipment and Method: Stylet and Oral airway Placement Confirmation: ETT inserted through vocal cords under direct vision,  positive ETCO2 and breath sounds checked- equal and bilateral Secured at: 23 cm Tube secured with: Tape Dental Injury: Teeth and Oropharynx as per pre-operative assessment

## 2019-05-11 NOTE — Anesthesia Postprocedure Evaluation (Signed)
Anesthesia Post Note  Patient: Gloria Weber  Procedure(s) Performed: LAPAROSCOPIC BILATERAL SALPINGECTOMY,  WITH BIOPSIES OF RIGHT CUL-DE-SAC AND RIGHT PELVIC SIDEWALL (Bilateral Abdomen)     Patient location during evaluation: PACU Anesthesia Type: General Level of consciousness: awake and alert Pain management: pain level controlled Vital Signs Assessment: post-procedure vital signs reviewed and stable Respiratory status: spontaneous breathing, nonlabored ventilation, respiratory function stable and patient connected to nasal cannula oxygen Cardiovascular status: blood pressure returned to baseline and stable Postop Assessment: no apparent nausea or vomiting Anesthetic complications: no    Last Vitals:  Vitals:   05/11/19 1600 05/11/19 1615  BP: 103/72 97/60  Pulse: 87 91  Resp: 15 11  Temp:  36.7 C  SpO2: 100% 99%    Last Pain:  Vitals:   05/11/19 1721  TempSrc:   PainSc: 4                  Barnet Glasgow

## 2019-05-11 NOTE — Discharge Instructions (Signed)
Call [facility] @ [phone] if:  You have a temperature greater than or equal to 100.4 degrees Farenheit orally You have pain that is not made better by the pain medication given and taken as directed You have excessive bleeding or problems urinating  Take Colace (Docusate Sodium/Stool Softener) 100 mg 2-3 times daily while taking narcotic pain medicine to avoid constipation or until bowel movements are regular.  You may drive after 24 hours You may walk up steps  You may shower tomorrow You may resume a regular diet  Keep incisions clean and dry Do not lift over 15 pounds until after your post operative visit Avoid anything in vagina until after your post-operative visit   DISCHARGE INSTRUCTIONS: Laparoscopy  The following instructions have been prepared to help you care for yourself upon your return home today.  Wound care: Marland Kitchen Do not get the incision wet for the first 24 hours. The incision should be kept clean and dry. . The Band-Aids or dressings may be removed the day after surgery. . Should the incision become sore, red, and swollen after the first week, check with your doctor.  Personal hygiene: . Shower the day after your procedure.  Activity and limitations: . Do NOT drive or operate any equipment today. . Do NOT lift anything more than 15 pounds for 2-3 weeks after surgery. . Do NOT rest in bed all day. . Walking is encouraged. Walk each day, starting slowly with 5-minute walks 3 or 4 times a day. Slowly increase the length of your walks. . Walk up and down stairs slowly. . Do NOT do strenuous activities, such as golfing, playing tennis, bowling, running, biking, weight lifting, gardening, mowing, or vacuuming for 2-4 weeks. Ask your doctor when it is okay to start.  Diet: Eat a light meal as desired this evening. You may resume your usual diet tomorrow.  Return to work: This is dependent on the type of work you do. For the most part you can return to  a desk job within a week of surgery. If you are more active at work, please discuss this with your doctor.  What to expect after your surgery: You may have a slight burning sensation when you urinate on the first day. You may have a very small amount of blood in the urine. Expect to have a small amount of vaginal discharge/light bleeding for 1-2 weeks. It is not unusual to have abdominal soreness and bruising for up to 2 weeks. You may be tired and need more rest for about 1 week. You may experience shoulder pain for 24-72 hours. Lying flat in bed may relieve it.  Call your doctor for any of the following: . Develop a fever of 100.4 or greater . Inability to urinate 6 hours after discharge from hospital . Severe pain not relieved by pain medications . Persistent of heavy bleeding at incision site . Redness or swelling around incision site after a week . Increasing nausea or vomiting   Post Anesthesia Home Care Instructions  Activity: Get plenty of rest for the remainder of the day. A responsible individual must stay with you for 24 hours following the procedure.  For the next 24 hours, DO NOT: -Drive a car -Advertising copywriter -Drink alcoholic beverages -Take any medication unless instructed by your physician -Make any legal decisions or sign important papers.  Meals: Start with liquid foods such as gelatin or soup. Progress to regular foods as tolerated. Avoid greasy, spicy, heavy foods. If nausea and/or  vomiting occur, drink only clear liquids until the nausea and/or vomiting subsides. Call your physician if vomiting continues.  Special Instructions/Symptoms: Your throat may feel dry or sore from the anesthesia or the breathing tube placed in your throat during surgery. If this causes discomfort, gargle with warm salt water. The discomfort should disappear within 24 hours.  If you had a scopolamine patch placed behind your ear for the management of post- operative nausea and/or  vomiting:  1. The medication in the patch is effective for 72 hours, after which it should be removed.  Wrap patch in a tissue and discard in the trash. Wash hands thoroughly with soap and water. 2. You may remove the patch earlier than 72 hours if you experience unpleasant side effects which may include dry mouth, dizziness or visual disturbances. 3. Avoid touching the patch. Wash your hands with soap and water after contact with the patch.

## 2019-05-11 NOTE — Interval H&P Note (Signed)
History and Physical Interval Note:  05/11/2019 1:40 PM  Gloria Weber  has presented today for surgery, with the diagnosis of Desire for sterilization, pelvic pain.  The various methods of treatment have been discussed with the patient and family. After consideration of risks, benefits and other options for treatment, the patient has consented to  Procedure(s): LAPAROSCOPIC BILATERAL SALPINGECTOMY, POSSIBLE LAPAROSCOPY OPERATIVE POSSIBLE RESECTION OF ENDOMETRIOSIS (Bilateral) AND REMOVAL OF DISPLACED FILSHIE CLIPS as a surgical intervention.  The patient's history has been reviewed, patient examined, no change in status, stable for surgery.  I have reviewed the patient's chart and labs.  Questions were answered to the patient's satisfaction.    Alinda Dooms. MD.

## 2019-05-11 NOTE — Transfer of Care (Signed)
Immediate Anesthesia Transfer of Care Note  Patient: [name]  Procedure(s) Performed: Procedure(s) (LRB): LAPAROSCOPIC BILATERAL SALPINGECTOMY,  WITH BIOPSIES OF RIGHT CUL-DE-SAC AND RIGHT PELVIC SIDEWALL (Bilateral)  Patient Location: PACU  Anesthesia Type: General  Level of Consciousness: awake, sedated, patient cooperative and responds to stimulation  Airway & Oxygen Therapy: Patient Spontanous Breathing and Patient connected to [place] 02 and soft FM  Post-op Assessment: Report given to PACU RN, Post -op Vital signs reviewed and stable and Patient moving all extremities  Post vital signs: Reviewed and stable  Complications: No apparent anesthesia complications

## 2019-05-11 NOTE — H&P (Signed)
Gloria Weber is an 33 y.o. female here for removal of displaced fallopian tube clips and bilateral salpingectomy for assured permanent sterilization.  She also has a history of chronic pelvic pain, worse before/with menses and desires laparoscopic exploration and removal of any endometriosis or lesions if found inside.  Pertinent Gynecological History: Menses:monthly, heavy periods Contraception: Failed tubal ligation via fishie clips DES exposure: unknown Blood transfusions: none Sexually transmitted diseases: no past history Previous GYN Procedures: DNC  Last mammogram: N/A Date: N/A Last pap: normal Date: 04/21/2019 OB History: G2, P2002  Menstrual History: Patient's last menstrual period was 05/03/2019.    Past Medical History:  Diagnosis Date  . ADD (attention deficit disorder)   . Chronic kidney disease    2017 INFECTIONS HOSPITALIZED FOR  . Pelvic pain   . Sciatica     Past Surgical History:  Procedure Laterality Date  . CERVICAL CONIZATION W/BX  01/28/2009  . DILATION AND CURETTAGE OF UTERUS  01/28/2009  . LAPAROSCOPIC TUBAL LIGATION Bilateral 06/12/2017   Procedure: LAPAROSCOPIC TUBAL LIGATION WITH FILSHIE CLIPS;  Surgeon: Donnamae Jude, MD;  Location: [place];  Service: Gynecology;  Laterality: Bilateral;  . LEEP  2010  . PILONIDAL CYST EXCISION  08/27/2008  . TONSILLECTOMY  01/11/2006    Family History  Problem Relation Age of Onset  . Heart disease Father   . Diabetes Maternal Grandmother     Social History:  reports that she quit smoking about 2 years ago. She has never used smokeless tobacco. She reports current alcohol use. She reports that she does not use drugs.  Allergies:  Allergies  Allergen Reactions  . Nsaids Other (See Comments)    EXCESSIVE BLEEDING, STOMACHACHE  . Adhesive [Tape] Rash    PLASTIC TAPE    Medications Prior to Admission  Medication Sig Dispense Refill Last Dose  . HYDROcodone-acetaminophen  (NORCO/VICODIN) 5-325 MG tablet Take 1 tablet by mouth 2 (two) times daily as needed for moderate pain.    05/11/2019 at [facility]  . methylphenidate (RITALIN) 20 MG tablet Take 3 tablets (60 mg total) by mouth daily. (Patient taking differently: Take 20 mg by mouth 3 (three) times daily. ) 180 tablet 0 05/10/2019 at Unknown time  . Multiple Vitamin (MULTIVITAMIN) tablet Take 1 tablet by mouth daily.   05/10/2019 at Unknown time  . traMADol (ULTRAM) 50 MG tablet Take 100 mg by mouth 3 (three) times daily.   05/10/2019 at Unknown time    Review of Systems Constitutional: Denies fevers/chills Cardiovascular: Denies chest pain or palpitations Pulmonary: Denies coughing or wheezing Gastrointestinal: Denies nausea, vomiting or diarrhea Genitourinary: Denies pelvic pain, unusual vaginal bleeding, unusual vaginal discharge, dysuria, urgency or frequency.  Musculoskeletal: Denies muscle or joint aches and pain.  Neurology: Denies abnormal sensations such as tingling or numbness.   Blood pressure 104/74, pulse 70, temperature 97.9 F (36.6 C), temperature source Skin, resp. rate 16, height 5\' 1"  (1.549 m), weight 69.6 kg, last menstrual period 05/03/2019, SpO2 99 %. Physical Exam Constitutional: She is oriented to person, place, and time. She appears well-developed and well-nourished.  HENT:  Head: Normocephalic and atraumatic.  Neck: Normal range of motion.  Cardiovascular: Normal rate, regular rhythm and normal heart sounds.   Respiratory: Effort normal and breath sounds normal.  GI: Soft. Bowel sounds are normal.  Neurological: She is alert and oriented to person, place, and time.  Skin: Skin is warm and dry.  Psychiatric: She has a normal mood and affect. Her behavior is  normal.   Results for orders placed or performed during the hospital encounter of 05/11/19 (from the past 24 hour(s))  CBC     Status: None   Collection Time: 05/11/19 11:55 AM  Result Value Ref Range   WBC 7.9 4.0 - 10.5 K/uL    RBC 4.43 3.87 - 5.11 MIL/uL   Hemoglobin 13.4 12.0 - 15.0 g/dL   HCT 60.6 30.1 - 60.1 %   MCV 94.4 80.0 - 100.0 fL   MCH 30.2 26.0 - 34.0 pg   MCHC 32.1 30.0 - 36.0 g/dL   RDW 09.3 23.5 - 57.3 %   Platelets 254 150 - 400 K/uL   nRBC 0.0 0.0 - 0.2 %   Novel Coronavirus, NAA (Hosp order, Send-out to [employer]; TAT 18-24 hrs Order: [id number] Status:  Final result Visible to patient:  Yes (MyChart) Next appt:  None Specimen Information: Nasopharyngeal Swab; Respiratory      Ref Range & Units 4 d ago  SARS-CoV-2, NAA NOT DETECTED NOT DETECTED        05/11/19: Pregnancy test:    IMAGES REVIEWED:   PELVIC CT SCAN: 10/31/2018: NORMAL APPENDIX, NEGATIVE ABDOMEN AND PELVIC CT.   PELVIC US 10/30/18: NORMAL PELVIC US. NORMAL ADNEXA BILATERALLY.  PELVIC CT 03/30/2019: INTERVAL MIGRATION OF RIGHT TUBAL LIGATION CLIP WHICH IS NOW LOCATED IN THE POSTERIOR CUL-DE-SAC WHERE THERE IS A SMALL AMOUNT OF ADJACENT FREE FLUID. THE LEFT TUBAL LIGATION CLIP PIS UNCHANGED IN POSITIONING. NO ACUTE INFLAMMATION OR OBSTRUCTION. NORMAL APPENDIX. SMALL FAT CONTAINING UMBILICAL HERNIA.   Pregnancy, urine POC Order: [id number] Status:  Final result Visible to patient:  No (scheduled for 05/11/2019 1:53 PM) Next appt:  None  Ref Range & Units 12:51 1 mo ago 1 yr ago 2 yr ago 3 yr ago 8 yr ago 9 yr ago  Preg Test, Ur NEGATIVE NEGATIVE              Assessment/Plan: 33 y/o P2 with a failed tubal ligation via filshie clips here for removal of displaced filshie clips from right fallopian tube, bilateral tubal salpingectomy and pelvic exploration and possible removal or fulguration of endometriosis lesions if found  Admit to [facility] NPO and IV fluids Consent for the the procedure This procedure has been fully reviewed with the patient and written informed consent has been obtained. Konrad Felix, MD 05/11/2019, 12:19 PM

## 2019-05-11 NOTE — Op Note (Addendum)
Gloria Weber DOB: 1985-10-09.  MRN: [id number]  PREOP DIAGNOSIS:  1. 33 y/o para 2 desiring permanent sterilization.  2. Failed prior tubal ligation with displacement of right fallopian tube filshie clips.  3. Chronic pelvic pain.   POSTOP DIAGNOSIS:  1. 33 y/o para 2 desiring permanent sterilization.  2. Failed prior tubal ligation with displacement of right fallopian tube filshie clips.  3. Chronic pelvic pain. 4.  Endometriosis lesions on right pelvic side wall, right cul de sac- Stage I.   PROCEDURES:  1. Laparoscopic bilateral salpingectomy. 2. Removal of displaced right fallopian tube filshie clips 3. Resection of endometriosis lesions in right abdominal wall and right cul de sac.    SURGEON: Dr. Waymon Amato  ASSISTANT: Earnstine Regal, PA.   ANESTHESIA: General  COMPLICATIONS: None  EBL: 50 mL  IV FLUID: per anesthesia records  URINE OUTPUT:  per anesthesia records  FINDINGS: Normal uterus. Left fallopian tube appeared normal with filshie clip in place.  Normal right fallopian tubes.  Filshie clip in right cul de sac. Normal left and right ovaries.  Normal appearing appendix with prominent vasculature.     PROCEDURE:  Informed consent was obtained from the patient to undergo the procedure after discussing the risks benefits and alternatives of the procedure. She was taken to the operating room where anesthesia was administered without difficulty. Both arms were tucked and she was placed in the dorsal lithotomy position. She was prepped abdominally, vaginally and perineum in the usual sterile fashion. Foley catheter was placed in the bladder and an acorn uterine manipulator was placed in.     Attention was then turned to the abdomen where lidocaine with epinephrine was instilled in the infraumbilical area. The skin was incised with a scalpel and and entry into the abdomen was made through the subcutaneous fascia and peritoneal layers using Allis clamps  hemostats and retractors. The fascia was tied with 0 Vicryl at the edges.  The 11 mm excel trocar was placed in and gas 20 mL gas instilled into the bulb, the abdomen was insufflated with gas, abdominal pressure maintained at 65mmHg.  The scope was then placed in and the pelvis was visualized. Two 5 mm ports were placed the using the XL trocar on the right and left abdomen under direct visualization at umbilical level and 10 cm lateral to the umbilicus. The left fallopian tube was then removed using the LigaSure and placed in the cul de sac.  The filshie clips in the cul de sac was retrieved and placed in the cul de sac.  The right fallopian tube was then removed with the ligasure.  All the specimen and clips were removed through the 91mm port.  Endoshears was used to remove an area with brown lesions on the right abdominal wall after tenting up the area with a grasper.  Ligasure was used for hemostasis after removal of the lesion.  Biopsy forceps was used to remove an area in the right cul de sac with similar brown lesion, I suspect both are endometriosis lesions.   No other lesions were noted.  The appendix appeared normal with prominent vasculature.  The pelvis was inspected and it was noted to be hemostatic. Small blood in the cul de sac was suctioned out.   All instruments were removed under direct visualization.    Umbilical incision was closed using 0 Vicryl  Suture, the skin was closed with 4-0 Monocryl. The 2 side ports incisions were also closed with 4-0 Monocryl. Dermabond was placed.  The Foley catheter and the uterine manipulator were removed.  The patient was then awoken from anesthesia and she was taken to recovery room in stable condition.  SPECIMEN: Left fallopian tube with fishie clip.  Right fallopian tube.  Displaced filshie clip.  Lesion from right abdominal wall and right cul de sac suspicious for endometriosis.    Gloria Douse Sallye Ober, MD.

## 2019-05-12 LAB — SURGICAL PATHOLOGY

## 2019-09-24 ENCOUNTER — Other Ambulatory Visit: Payer: Self-pay | Admitting: Obstetrics & Gynecology

## 2019-10-29 ENCOUNTER — Other Ambulatory Visit: Payer: Self-pay | Admitting: Obstetrics & Gynecology

## 2019-11-04 ENCOUNTER — Other Ambulatory Visit: Payer: Self-pay

## 2019-11-04 ENCOUNTER — Encounter (HOSPITAL_COMMUNITY)
Admission: RE | Admit: 2019-11-04 | Discharge: 2019-11-04 | Disposition: A | Discharge status: Home / Self Care | Payer: PRIVATE HEALTH INSURANCE | Source: Ambulatory Visit | Attending: Obstetrics & Gynecology | Admitting: Obstetrics & Gynecology

## 2019-11-04 ENCOUNTER — Encounter (HOSPITAL_COMMUNITY): Payer: Self-pay

## 2019-11-04 DIAGNOSIS — Z01812 Encounter for preprocedural laboratory examination: Secondary | ICD-10-CM | POA: Insufficient documentation

## 2019-11-04 NOTE — Progress Notes (Signed)
COVID Vaccine Completed:No Date COVID Vaccine completed: COVID vaccine manufacturer: Pfizer    Moderna   [employer]   PCP - Dr. Elizebeth Koller Cardiologist -   Chest x-ray -  EKG -  Stress Test -  ECHO -  Cardiac Cath -   Sleep Study -  CPAP -   Fasting Blood Sugar -  Checks Blood Sugar _____ times a day  Blood Thinner Instructions: Aspirin Instructions: Last Dose:  Anesthesia review:   Patient denies shortness of breath, fever, cough and chest pain at PAT appointment   Patient verbalized understanding of instructions that were given to them at the PAT appointment. Patient was also instructed that they will need to review over the PAT instructions again at home before surgery.

## 2019-11-04 NOTE — Patient Instructions (Signed)
DUE TO COVID-19 ONLY ONE VISITOR IS ALLOWED IN WAITING ROOM (VISITOR WILL HAVE A TEMPERATURE CHECK ON ARRIVAL AND MUST WEAR A FACE MASK THE ENTIRE TIME.)  ONCE YOU ARE ADMITTED TO YOUR PRIVATE ROOM, THE SAME ONE VISITOR IS ALLOWED TO VISIT DURING VISITING HOURS ONLY.  Your COVID swab testing is scheduled for: 11/13/19  At: 2:50 pm  , You must self quarantine after your testing per handout given to you at the testing site.  ([place] up testing enter pre-surgical testing line)    Your procedure is scheduled on: 11/17/19  Report to [place] AT: 10:30  A. M.   Call this number if you have problems the morning of [phone].   OUR ADDRESS IS [address].  WE ARE LOCATED IN THE [place] [place]                                   [facility].                                     REMEMBER:  DO NOT EAT SOLID FOOD AFTER MIDNIGHT .  CLEAR LIQUIDS FROM MIDNIGHT UNTIL:9:30 am   CLEAR LIQUID DIET   Foods Allowed                                                                     Foods Excluded  Coffee and tea, regular and decaf                             liquids that you cannot  Plain Jell-O any favor except red or purple                                           see through such as: Fruit ices (not with fruit pulp)                                     milk, soups, orange juice  Iced Popsicles                                    All solid food Carbonated beverages, regular and diet                                    Cranberry, grape and apple juices Sports drinks like Gatorade Lightly seasoned clear broth or consume(fat free) Sugar, honey syrup  Sample Menu Breakfast                                Lunch  Supper Cranberry juice                    Beef broth                            Chicken broth Jell-O                                     Grape juice                            Apple juice Coffee or tea                        Jell-O                                      Popsicle                                                Coffee or tea                        Coffee or tea  _____________________________________________________________________  BRUSH YOUR TEETH THE MORNING OF SURGERY.  TAKE THESE MEDICATIONS MORNING OF SURGERY WITH A SIP OF WATER:  Methylphenidate.  DO NOT WEAR JEWERLY, MAKE UP, OR NAIL POLISH.  DO NOT WEAR LOTIONS, POWDERS, PERFUMES/COLOGNE OR DEODORANT.  DO NOT SHAVE FOR 24 HOURS PRIOR TO DAY OF SURGERY.  CONTACTS, GLASSES, OR DENTURES MAY NOT BE WORN TO SURGERY.                                    [facility] IS NOT RESPONSIBLE  FOR ANY BELONGINGS.          BRING ALL PRESCRIPTION MEDICATIONS WITH YOU THE DAY OF SURGERY IN ORIGINAL CONTAINERS                                                              YOU MAY BRING A SMALL OVERNIGHT BAG  [place] - Preparing for Surgery Before surgery, you can play an important role.  Because skin is not sterile, your skin needs to be as free of germs as possible.  You can reduce the number of germs on your skin by washing with CHG (chlorahexidine gluconate) soap before surgery.  CHG is an antiseptic cleaner which kills germs and bonds with the skin to continue killing germs even after washing. Please DO NOT use if you have an allergy to CHG or antibacterial soaps.  If your skin becomes reddened/irritated stop using the CHG and inform your nurse when you arrive at Short Stay. Do not shave (including legs and underarms) for at least 48 hours prior to the first CHG shower.  You may shave your face/neck. Please follow these instructions carefully:  1.  Shower with CHG Soap the night before surgery and the  morning of Surgery.  2.  If you choose to wash your hair, wash your hair first as usual with your  normal  shampoo.  3.  After you shampoo, rinse your hair and body thoroughly to remove the  shampoo.                            4.  Use CHG as you would any other liquid soap.  You can apply chg directly  to the skin and wash                       Gently with a scrungie or clean washcloth.  5.  Apply the CHG Soap to your body ONLY FROM THE NECK DOWN.   Do not use on face/ open                           Wound or open sores. Avoid contact with eyes, ears mouth and genitals (private parts).                       Wash face,  Genitals (private parts) with your normal soap.             6.  Wash thoroughly, paying special attention to the area where your surgery  will be performed.  7.  Thoroughly rinse your body with warm water from the neck down.  8.  DO NOT shower/wash with your normal soap after using and rinsing off  the CHG Soap.                9.  Pat yourself dry with a clean towel.            10.  Wear clean pajamas.            11.  Place clean sheets on your bed the night of your first shower and do not  sleep with pets. Day of Surgery : Do not apply any lotions/deodorants the morning of surgery.  Please wear clean clothes to the hospital/surgery center.  FAILURE TO FOLLOW THESE INSTRUCTIONS MAY RESULT IN THE CANCELLATION OF YOUR SURGERY PATIENT SIGNATURE_________________________________  NURSE SIGNATURE__________________________________  ________________________________________________________________________

## 2019-11-06 ENCOUNTER — Encounter (HOSPITAL_COMMUNITY)
Admission: RE | Admit: 2019-11-06 | Discharge: 2019-11-06 | Disposition: A | Discharge status: Home / Self Care | Payer: PRIVATE HEALTH INSURANCE | Source: Ambulatory Visit | Attending: Obstetrics & Gynecology | Admitting: Obstetrics & Gynecology

## 2019-11-06 ENCOUNTER — Other Ambulatory Visit: Payer: Self-pay

## 2019-11-06 DIAGNOSIS — Z01812 Encounter for preprocedural laboratory examination: Secondary | ICD-10-CM | POA: Diagnosis not present

## 2019-11-06 LAB — CBC
HCT: 43.1 % (ref 36.0–46.0)
Hemoglobin: 13.9 g/dL (ref 12.0–15.0)
MCH: 30.7 pg (ref 26.0–34.0)
MCHC: 32.3 g/dL (ref 30.0–36.0)
MCV: 95.1 fL (ref 80.0–100.0)
Platelets: 268 10*3/uL (ref 150–400)
RBC: 4.53 MIL/uL (ref 3.87–5.11)
RDW: 12.7 % (ref 11.5–15.5)
WBC: 9.3 10*3/uL (ref 4.0–10.5)
nRBC: 0 % (ref 0.0–0.2)

## 2019-11-06 LAB — BASIC METABOLIC PANEL
Anion gap: 9 (ref 5–15)
BUN: 10 mg/dL (ref 6–20)
CO2: 27 mmol/L (ref 22–32)
Calcium: 9.5 mg/dL (ref 8.9–10.3)
Chloride: 104 mmol/L (ref 98–111)
Creatinine, Ser: 0.56 mg/dL (ref 0.44–1.00)
GFR calc Af Amer: >60 mL/min (ref >60)
GFR calc non Af Amer: >60 mL/min (ref >60)
Glucose, Bld: 83 mg/dL (ref 70–99)
Potassium: 4.6 mmol/L (ref 3.5–5.1)
Sodium: 140 mmol/L (ref 135–145)

## 2019-11-06 LAB — TYPE AND SCREEN
ABO/RH(D): A POS
Antibody Screen: NEGATIVE

## 2019-11-07 LAB — ABO/RH: ABO/RH(D): A POS

## 2019-11-13 ENCOUNTER — Other Ambulatory Visit (HOSPITAL_COMMUNITY)
Admission: RE | Admit: 2019-11-13 | Discharge: 2019-11-13 | Disposition: A | Discharge status: Home / Self Care | Payer: PRIVATE HEALTH INSURANCE | Source: Ambulatory Visit | Attending: Obstetrics & Gynecology | Admitting: Obstetrics & Gynecology

## 2019-11-13 DIAGNOSIS — Z01812 Encounter for preprocedural laboratory examination: Secondary | ICD-10-CM | POA: Diagnosis not present

## 2019-11-13 DIAGNOSIS — Z20822 Contact with and (suspected) exposure to covid-19: Secondary | ICD-10-CM | POA: Diagnosis not present

## 2019-11-13 LAB — SARS CORONAVIRUS 2 (TAT 6-24 HRS): SARS Coronavirus 2: NEGATIVE

## 2019-11-16 NOTE — H&P (Signed)
Gloria Weber is an 34 y.o. female para 3 with a history or endometriosis stage 1, chronic pelvic pain,recurrent right sided mid and lower abdominal pain, abnormal uterine bleeding and uterine polyp who has failed medical management with Dewayne Hatch, narcotics and now desiring definitive management via hysterectomy and possible appendectomy.    Pertinent Gynecological History: Menses: Periods are excessive, frequent and irregular.   Bleeding: intermenstrual bleeding.  Contraception: tubal ligation DES exposure: unknown Blood transfusions: none Sexually transmitted diseases: no past history Previous GYN Procedures: Laparoscopy, LEEP.   Last mammogram: Not due.  Date: N/A Last pap: normal Date: 04/2019 OB History: G3, P2103   Menstrual History: Patient's last menstrual period was 11/02/2019.    Past Medical History:  Diagnosis Date  . ADD (attention deficit disorder)   . Anemia   . Chronic kidney disease    2017 INFECTIONS HOSPITALIZED FOR  . Pelvic pain   . Sciatica     Past Surgical History:  Procedure Laterality Date  . CERVICAL CONIZATION W/BX  01/28/2009  . DILATION AND CURETTAGE OF UTERUS  01/28/2009  . LAPAROSCOPIC BILATERAL SALPINGECTOMY Bilateral 05/11/2019   Procedure: LAPAROSCOPIC BILATERAL SALPINGECTOMY,  WITH BIOPSIES OF RIGHT CUL-DE-SAC AND RIGHT PELVIC SIDEWALL;  Surgeon: Hoover Browns, MD;  Location: [facility];  Service: Gynecology;  Laterality: Bilateral;  . LAPAROSCOPIC TUBAL LIGATION Bilateral 06/12/2017   Procedure: LAPAROSCOPIC TUBAL LIGATION WITH FILSHIE CLIPS;  Surgeon: Reva Bores, MD;  Location: [facility];  Service: Gynecology;  Laterality: Bilateral;  . LEEP  2010  . PILONIDAL CYST EXCISION  08/27/2008  . TONSILLECTOMY  01/11/2006  . tubes removed      Family History  Problem Relation Age of Onset  . Heart disease Father   . Diabetes Maternal Grandmother     Social History:  reports that she quit smoking about 3  years ago. She has never used smokeless tobacco. She reports current alcohol use. She reports that she does not use drugs.  Allergies:  Allergies  Allergen Reactions  . Nsaids Other (See Comments)    EXCESSIVE BLEEDING, STOMACHACHE  . Adhesive [Tape] Rash    PLASTIC TAPE    No medications prior to admission.  No current facility-administered medications for this encounter.  Current Outpatient Medications:  .  CALCIUM PO, Take 1 tablet by mouth daily., Disp: , Rfl:  .  HYDROcodone-acetaminophen (NORCO/VICODIN) 5-325 MG tablet, take 1 tablet po every 6 hours as needed for post operative pain (Patient taking differently: Take 1 tablet by mouth in the morning, at noon, in the evening, and at bedtime. ), Disp: 16 tablet, Rfl: 0 .  lidocaine (LIDODERM) 5 %, Place 1 patch onto the skin daily as needed (pain)., Disp: , Rfl:  .  methylphenidate (METADATE ER) 20 MG ER tablet, Take 20 mg by mouth in the morning, at noon, and at bedtime., Disp: , Rfl:  .  Multiple Vitamin (MULTIVITAMIN) tablet, Take 1 tablet by mouth daily., Disp: , Rfl:  .  ORILISSA 150 MG TABS, Take 150 mg by mouth daily., Disp: , Rfl:  .  traMADol (ULTRAM) 50 MG tablet, Take 100 mg by mouth 3 (three) times daily as needed for moderate pain. , Disp: , Rfl:  .  methylphenidate (RITALIN) 20 MG tablet, Take 3 tablets (60 mg total) by mouth daily. (Patient not taking: Reported on 10/29/2019), Disp: 180 tablet, Rfl: 0  Review of Systems Constitutional: Denies fevers/chills Cardiovascular: Denies chest pain or palpitations Pulmonary: Denies coughing or wheezing Gastrointestinal: Denies  nausea, vomiting or diarrhea Genitourinary: With pelvic pain, with unusual vaginal bleeding, denies unusual vaginal discharge, dysuria, urgency or frequency.  Musculoskeletal: Denies muscle or joint aches and pain.  Neurology: Denies abnormal sensations such as tingling or numbness.   Last menstrual period 11/02/2019. Physical Exam  Height 14ft 1  inch, weight 149 lbs, BMI 28.  Blood pressure 115/78, pulse 83, temperature 99 F (37.2 C), temperature source Oral, resp. rate 15, height 5\' 1"  (1.549 m), weight 66.3 kg, last menstrual period 11/02/2019, SpO2 99 %. Constitutional: She is oriented to person, place, and time. She appears well-developed and well-nourished.  HENT:  Head: Normocephalic and atraumatic.  Neck: Normal range of motion.  Cardiovascular: Normal rate, regular rhythm and normal heart sounds.   Respiratory: Effort normal and breath sounds normal.  GI: Soft. Bowel sounds are normal.  Genitourinary: Tender to palpation in right mid and lower abdomen, no rebound, no guarding. Normal speculum exam, no lesions,no discharge.  Tenderness to palpation in right adnexa. Uterus with moderate descensus.  Neurological: She is alert and oriented to person, place, and time.  Skin: Skin is warm and dry.  Psychiatric: She has a normal mood and affect. Her behavior is normal.   Recent Results (from the past 2160 hour(s))  Basic metabolic panel     Status: None   Collection Time: 11/06/19  3:15 PM  Result Value Ref Range   Sodium 140 135 - 145 mmol/L   Potassium 4.6 3.5 - 5.1 mmol/L   Chloride 104 98 - 111 mmol/L   CO2 27 22 - 32 mmol/L   Glucose, Bld 83 70 - 99 mg/dL    Comment: Glucose reference range applies only to samples taken after fasting for at least 8 hours.   BUN 10 6 - 20 mg/dL   Creatinine, Ser 11/08/19 0.44 - 1.00 mg/dL   Calcium 9.5 8.9 - 5.09 mg/dL   GFR calc non Af Amer >60 >60 mL/min   GFR calc Af Amer >60 >60 mL/min   Anion gap 9 5 - 15    Comment: Performed at [facility], [address], [place], [place]  CBC     Status: None   Collection Time: 11/06/19  3:15 PM  Result Value Ref Range   WBC 9.3 4.0 - 10.5 K/uL   RBC 4.53 3.87 - 5.11 MIL/uL   Hemoglobin 13.9 12.0 - 15.0 g/dL   HCT 11/08/19 36 - 46 %   MCV 95.1 80.0 - 100.0 fL   MCH 30.7 26.0 - 34.0 pg   MCHC 32.3 30.0 - 36.0 g/dL    RDW 80.9 98.3 - 38.2 %   Platelets 268 150 - 400 K/uL   nRBC 0.0 0.0 - 0.2 %    Comment: Performed at [facility], [address], [place], [place]  Type and screen All cardiac and thoracic surgeries, spinal fusions, myomectomies, craniotomies, colon & liver resections, total joint revisions, same day c-section with placenta previa or accreta     Status: None   Collection Time: 11/06/19  3:15 PM  Result Value Ref Range   ABO/RH(D) A POS    Antibody Screen NEG    Sample Expiration 11/20/2019,2359    Extend sample reason      NO TRANSFUSIONS OR PREGNANCY IN THE PAST 3 MONTHS Performed at [facility], [address] [place], [place]   ABO/Rh     Status: None   Collection Time: 11/06/19  3:15  PM  Result Value Ref Range   ABO/RH(D)      A POS Performed at [facility], [place] [address] [place], [place] [address]   SARS CORONAVIRUS 2 (TAT 6-24 HRS) Nasopharyngeal Nasopharyngeal Swab     Status: None   Collection Time: 11/13/19  2:45 PM   Specimen: Nasopharyngeal Swab  Result Value Ref Range   SARS Coronavirus 2 NEGATIVE NEGATIVE    Comment: (NOTE) SARS-CoV-2 target nucleic acids are NOT DETECTED.  The SARS-CoV-2 RNA is generally detectable in upper and lower respiratory specimens during the acute phase of infection. Negative results do not preclude SARS-CoV-2 infection, do not rule out co-infections with other pathogens, and should not be used as the sole basis for treatment or other patient management decisions. Negative results must be combined with clinical observations, patient history, and epidemiological information. The expected result is Negative.  Fact Sheet for Patients: SugarRoll.be  Fact Sheet for Healthcare Providers: https://www.woods-mathews.com/  This test is not yet approved or cleared by the [place] FDA and  has been authorized for  detection and/or diagnosis of SARS-CoV-2 by FDA under an Emergency Use Authorization (EUA). This EUA will remain  in effect (meaning this test can be used) for the duration of the COVID-19 declaration under Se ction 564(b)(1) of the Act, 21 U.S.C. section 360bbb-3(b)(1), unless the authorization is terminated or revoked sooner.  Performed at [facility], [place] [address] [place], [place] [address]    Endometrial biopsy 09/2019: Endometrial polyp. No malignancy or abnormal cells.   Pelvic Ultrasound: 10/2018: Uterus 8.2 x 3.7 x 4.4 cm.  EMS normal.  Normal left and right ovaries.   Assessment/Plan: 34 y.o. female para 3 with a history or endometriosis stage 1, chronic pelvic pain, recurrent right sided mid and lower abdominal pain and abnormal uterine bleeding and uterine polyp who has failed medical management with Freida Busman, narcotics and now desiring definitive management via hysterectomy and possible appendectomy.   This procedure has been fully reviewed with the patient and written informed consent has been obtained. Admit to  [facility] NPO and IV fluids Proceed with procedures - Laparoscopic assisted vaginal hysterectomy, cystoscopy, possible appendectomy.   Alinda Dooms, MD.  11/16/2019, 10:07 PM

## 2019-11-17 ENCOUNTER — Encounter (HOSPITAL_BASED_OUTPATIENT_CLINIC_OR_DEPARTMENT_OTHER): Payer: Self-pay | Admitting: Obstetrics & Gynecology

## 2019-11-17 ENCOUNTER — Ambulatory Visit (HOSPITAL_BASED_OUTPATIENT_CLINIC_OR_DEPARTMENT_OTHER): Payer: PRIVATE HEALTH INSURANCE | Admitting: Anesthesiology

## 2019-11-17 ENCOUNTER — Encounter (HOSPITAL_BASED_OUTPATIENT_CLINIC_OR_DEPARTMENT_OTHER)
Admission: RE | Disposition: A | Discharge status: Home / Self Care | Payer: Self-pay | Source: Home / Self Care | Attending: Obstetrics & Gynecology

## 2019-11-17 ENCOUNTER — Ambulatory Visit (HOSPITAL_BASED_OUTPATIENT_CLINIC_OR_DEPARTMENT_OTHER)
Admission: RE | Admit: 2019-11-17 | Discharge: 2019-11-18 | Disposition: A | Discharge status: Home / Self Care | Payer: PRIVATE HEALTH INSURANCE | Attending: Obstetrics & Gynecology | Admitting: Obstetrics & Gynecology

## 2019-11-17 ENCOUNTER — Other Ambulatory Visit: Payer: Self-pay

## 2019-11-17 DIAGNOSIS — Z9851 Tubal ligation status: Secondary | ICD-10-CM | POA: Insufficient documentation

## 2019-11-17 DIAGNOSIS — N8000 Endometriosis of the uterus, unspecified: Secondary | ICD-10-CM | POA: Diagnosis present

## 2019-11-17 DIAGNOSIS — D631 Anemia in chronic kidney disease: Secondary | ICD-10-CM | POA: Insufficient documentation

## 2019-11-17 DIAGNOSIS — N840 Polyp of corpus uteri: Secondary | ICD-10-CM | POA: Diagnosis not present

## 2019-11-17 DIAGNOSIS — N72 Inflammatory disease of cervix uteri: Secondary | ICD-10-CM | POA: Diagnosis not present

## 2019-11-17 DIAGNOSIS — R109 Unspecified abdominal pain: Secondary | ICD-10-CM | POA: Diagnosis not present

## 2019-11-17 DIAGNOSIS — R1030 Lower abdominal pain, unspecified: Secondary | ICD-10-CM | POA: Diagnosis not present

## 2019-11-17 DIAGNOSIS — N189 Chronic kidney disease, unspecified: Secondary | ICD-10-CM | POA: Insufficient documentation

## 2019-11-17 DIAGNOSIS — Z87891 Personal history of nicotine dependence: Secondary | ICD-10-CM | POA: Insufficient documentation

## 2019-11-17 DIAGNOSIS — Z79899 Other long term (current) drug therapy: Secondary | ICD-10-CM | POA: Insufficient documentation

## 2019-11-17 DIAGNOSIS — N921 Excessive and frequent menstruation with irregular cycle: Secondary | ICD-10-CM | POA: Insufficient documentation

## 2019-11-17 DIAGNOSIS — N938 Other specified abnormal uterine and vaginal bleeding: Secondary | ICD-10-CM | POA: Diagnosis not present

## 2019-11-17 DIAGNOSIS — R102 Pelvic and perineal pain: Secondary | ICD-10-CM | POA: Diagnosis not present

## 2019-11-17 DIAGNOSIS — G8929 Other chronic pain: Secondary | ICD-10-CM | POA: Insufficient documentation

## 2019-11-17 HISTORY — PX: LAPAROSCOPIC VAGINAL HYSTERECTOMY WITH SALPINGECTOMY: SHX6680

## 2019-11-17 HISTORY — PX: CYSTOSCOPY: SHX5120

## 2019-11-17 LAB — POCT PREGNANCY, URINE: Preg Test, Ur: NEGATIVE

## 2019-11-17 SURGERY — HYSTERECTOMY, VAGINAL, LAPAROSCOPY-ASSISTED, WITH SALPINGECTOMY
Anesthesia: General | Laterality: Bilateral

## 2019-11-17 MED ORDER — ROCURONIUM BROMIDE 10 MG/ML (PF) SYRINGE
PREFILLED_SYRINGE | INTRAVENOUS | Status: AC
  Filled 2019-11-17: qty 10

## 2019-11-17 MED ORDER — ONDANSETRON HCL 4 MG/2ML IJ SOLN: 4.0000 mg | Freq: Four times a day (QID) | INTRAMUSCULAR | Status: DC | PRN

## 2019-11-17 MED ORDER — HYDROCODONE-ACETAMINOPHEN 5-325 MG PO TABS
ORAL_TABLET | ORAL | Status: AC
  Filled 2019-11-17: qty 2

## 2019-11-17 MED ORDER — DEXMEDETOMIDINE HCL IN NACL 200 MCG/50ML IV SOLN
INTRAVENOUS | Status: DC | PRN
  Administered 2019-11-17: 4 ug via INTRAVENOUS
  Administered 2019-11-17: 8 ug via INTRAVENOUS
  Administered 2019-11-17 (×2): 4 ug via INTRAVENOUS

## 2019-11-17 MED ORDER — PROPOFOL 10 MG/ML IV BOLUS
INTRAVENOUS | Status: AC
  Filled 2019-11-17: qty 20

## 2019-11-17 MED ORDER — OXYCODONE HCL 5 MG/5ML PO SOLN: 5.0000 mg | Freq: Once | ORAL | Status: DC | PRN

## 2019-11-17 MED ORDER — ROCURONIUM BROMIDE 10 MG/ML (PF) SYRINGE
PREFILLED_SYRINGE | INTRAVENOUS | Status: DC | PRN
  Administered 2019-11-17: 50 mg via INTRAVENOUS
  Administered 2019-11-17: 30 mg via INTRAVENOUS
  Administered 2019-11-17: 20 mg via INTRAVENOUS

## 2019-11-17 MED ORDER — CHLORHEXIDINE GLUCONATE 0.12 % MT SOLN: 15.0000 mL | Freq: Once | OROMUCOSAL | Status: DC

## 2019-11-17 MED ORDER — CEFAZOLIN SODIUM-DEXTROSE 2-4 GM/100ML-% IV SOLN
INTRAVENOUS | Status: AC
  Filled 2019-11-17: qty 100

## 2019-11-17 MED ORDER — LACTATED RINGERS IV SOLN: INTRAVENOUS | Status: DC

## 2019-11-17 MED ORDER — ONDANSETRON HCL 4 MG/2ML IJ SOLN
INTRAMUSCULAR | Status: AC
  Filled 2019-11-17: qty 2

## 2019-11-17 MED ORDER — SODIUM CHLORIDE 0.9 % IR SOLN
Status: DC | PRN
  Administered 2019-11-17: 3000 mL

## 2019-11-17 MED ORDER — LIDOCAINE 2% (20 MG/ML) 5 ML SYRINGE
INTRAMUSCULAR | Status: DC | PRN
  Administered 2019-11-17: 60 mg via INTRAVENOUS

## 2019-11-17 MED ORDER — FENTANYL CITRATE (PF) 100 MCG/2ML IJ SOLN
INTRAMUSCULAR | Status: AC
  Filled 2019-11-17: qty 2

## 2019-11-17 MED ORDER — OXYCODONE-ACETAMINOPHEN 5-325 MG PO TABS
ORAL_TABLET | ORAL | Status: AC
  Filled 2019-11-17: qty 1

## 2019-11-17 MED ORDER — LIDOCAINE 2% (20 MG/ML) 5 ML SYRINGE
INTRAMUSCULAR | Status: AC
  Filled 2019-11-17: qty 5

## 2019-11-17 MED ORDER — LIDOCAINE-EPINEPHRINE 1 %-1:100000 IJ SOLN
INTRAMUSCULAR | Status: DC | PRN
  Administered 2019-11-17: 20 mL

## 2019-11-17 MED ORDER — DEXAMETHASONE SODIUM PHOSPHATE 10 MG/ML IJ SOLN
INTRAMUSCULAR | Status: DC | PRN
Start: 2019-11-17 — End: 2019-11-17
  Administered 2019-11-17: 5 mg via INTRAVENOUS

## 2019-11-17 MED ORDER — PROMETHAZINE HCL 25 MG/ML IJ SOLN: 6.2500 mg | INTRAMUSCULAR | Status: DC | PRN

## 2019-11-17 MED ORDER — SIMETHICONE 80 MG PO CHEW: 80.0000 mg | CHEWABLE_TABLET | Freq: Four times a day (QID) | ORAL | Status: DC | PRN

## 2019-11-17 MED ORDER — PANTOPRAZOLE SODIUM 40 MG PO TBEC
DELAYED_RELEASE_TABLET | ORAL | Status: AC
  Filled 2019-11-17: qty 1

## 2019-11-17 MED ORDER — SUGAMMADEX SODIUM 200 MG/2ML IV SOLN
INTRAVENOUS | Status: DC | PRN
  Administered 2019-11-17: 120 mg via INTRAVENOUS

## 2019-11-17 MED ORDER — PANTOPRAZOLE SODIUM 40 MG PO TBEC
40.0000 mg | DELAYED_RELEASE_TABLET | Freq: Every day | ORAL | Status: DC
  Administered 2019-11-17: 40 mg via ORAL

## 2019-11-17 MED ORDER — HYDROMORPHONE HCL 1 MG/ML IJ SOLN
INTRAMUSCULAR | Status: AC
  Filled 2019-11-17: qty 1

## 2019-11-17 MED ORDER — MIDAZOLAM HCL 2 MG/2ML IJ SOLN
INTRAMUSCULAR | Status: AC
  Filled 2019-11-17: qty 2

## 2019-11-17 MED ORDER — FENTANYL CITRATE (PF) 250 MCG/5ML IJ SOLN
INTRAMUSCULAR | Status: DC | PRN
  Administered 2019-11-17: 100 ug via INTRAVENOUS
  Administered 2019-11-17 (×2): 25 ug via INTRAVENOUS
  Administered 2019-11-17: 50 ug via INTRAVENOUS

## 2019-11-17 MED ORDER — HYDROCODONE-ACETAMINOPHEN 5-325 MG PO TABS
1.0000 | ORAL_TABLET | ORAL | Status: DC | PRN
  Administered 2019-11-17 – 2019-11-18 (×4): 2 via ORAL

## 2019-11-17 MED ORDER — MIDAZOLAM HCL 5 MG/5ML IJ SOLN
INTRAMUSCULAR | Status: DC | PRN
  Administered 2019-11-17: 2 mg via INTRAVENOUS

## 2019-11-17 MED ORDER — OXYCODONE HCL 5 MG PO TABS: 5.0000 mg | ORAL_TABLET | Freq: Once | ORAL | Status: DC | PRN

## 2019-11-17 MED ORDER — ORAL CARE MOUTH RINSE: 15.0000 mL | Freq: Once | OROMUCOSAL | Status: DC

## 2019-11-17 MED ORDER — ACETAMINOPHEN 500 MG PO TABS
ORAL_TABLET | ORAL | Status: AC
  Filled 2019-11-17: qty 2

## 2019-11-17 MED ORDER — DOCUSATE SODIUM 100 MG PO CAPS
ORAL_CAPSULE | ORAL | Status: AC
  Filled 2019-11-17: qty 1

## 2019-11-17 MED ORDER — ACETAMINOPHEN 500 MG PO TABS
1000.0000 mg | ORAL_TABLET | Freq: Once | ORAL | Status: AC
  Administered 2019-11-17: 1000 mg via ORAL

## 2019-11-17 MED ORDER — SCOPOLAMINE 1 MG/3DAYS TD PT72
MEDICATED_PATCH | TRANSDERMAL | Status: AC
  Filled 2019-11-17: qty 1

## 2019-11-17 MED ORDER — MENTHOL 3 MG MT LOZG: 1.0000 | LOZENGE | OROMUCOSAL | Status: DC | PRN

## 2019-11-17 MED ORDER — PROPOFOL 10 MG/ML IV BOLUS
INTRAVENOUS | Status: DC | PRN
  Administered 2019-11-17: 200 mg via INTRAVENOUS

## 2019-11-17 MED ORDER — DOCUSATE SODIUM 100 MG PO CAPS
100.0000 mg | ORAL_CAPSULE | Freq: Two times a day (BID) | ORAL | Status: DC
  Administered 2019-11-17: 100 mg via ORAL

## 2019-11-17 MED ORDER — HYDROMORPHONE HCL 1 MG/ML IJ SOLN
0.2500 mg | INTRAMUSCULAR | Status: DC | PRN
  Administered 2019-11-17 (×4): 0.5 mg via INTRAVENOUS

## 2019-11-17 MED ORDER — INDIGOTINDISULFONATE SODIUM 8 MG/ML IJ SOLN
INTRAMUSCULAR | Status: DC | PRN
Start: 2019-11-17 — End: 2019-11-17
  Administered 2019-11-17: 1 mL via INTRAVENOUS

## 2019-11-17 MED ORDER — SCOPOLAMINE 1 MG/3DAYS TD PT72
1.0000 | MEDICATED_PATCH | TRANSDERMAL | Status: DC
  Administered 2019-11-17: 1.5 mg via TRANSDERMAL

## 2019-11-17 MED ORDER — FUROSEMIDE 10 MG/ML IJ SOLN
INTRAMUSCULAR | Status: DC | PRN
Start: 2019-11-17 — End: 2019-11-17

## 2019-11-17 MED ORDER — ONDANSETRON HCL 4 MG PO TABS: 4.0000 mg | ORAL_TABLET | Freq: Four times a day (QID) | ORAL | Status: DC | PRN

## 2019-11-17 MED ORDER — CEFAZOLIN SODIUM-DEXTROSE 2-4 GM/100ML-% IV SOLN
2.0000 g | INTRAVENOUS | Status: AC
  Administered 2019-11-17: 2 g via INTRAVENOUS

## 2019-11-17 MED ORDER — ONDANSETRON HCL 4 MG/2ML IJ SOLN
INTRAMUSCULAR | Status: DC | PRN
  Administered 2019-11-17: 4 mg via INTRAVENOUS

## 2019-11-17 MED ORDER — HYDROMORPHONE HCL 1 MG/ML IJ SOLN
1.0000 mg | INTRAMUSCULAR | Status: DC | PRN
  Administered 2019-11-17 – 2019-11-18 (×2): 1 mg via INTRAVENOUS

## 2019-11-17 MED ORDER — FUROSEMIDE 10 MG/ML IJ SOLN
INTRAMUSCULAR | Status: DC | PRN
Start: 2019-11-17 — End: 2019-11-17
  Administered 2019-11-17: 5 mg via INTRAVENOUS

## 2019-11-17 MED ORDER — OXYCODONE-ACETAMINOPHEN 5-325 MG PO TABS: 1.0000 | ORAL_TABLET | Freq: Four times a day (QID) | ORAL | Status: DC | PRN

## 2019-11-17 SURGICAL SUPPLY — 63 items
ADH SKN CLS APL DERMABOND .7 (GAUZE/BANDAGES/DRESSINGS) ×2
APL SKNCLS STERI-STRIP NONHPOA (GAUZE/BANDAGES/DRESSINGS) ×2
APL SRG 38 LTWT LNG FL B (MISCELLANEOUS) ×2
APPLICATOR ARISTA FLEXITIP XL (MISCELLANEOUS) ×2 IMPLANT
BAG SPEC RTRVL LRG 6X4 10 (ENDOMECHANICALS)
BARRIER ADHS 3X4 INTERCEED (GAUZE/BANDAGES/DRESSINGS) ×2 IMPLANT
BENZOIN TINCTURE PRP APPL 2/3 (GAUZE/BANDAGES/DRESSINGS) ×4 IMPLANT
BLADE EXTENDED COATED 6.5IN (ELECTRODE) ×4 IMPLANT
BRR ADH 4X3 ABS CNTRL BYND (GAUZE/BANDAGES/DRESSINGS) ×2
CABLE HIGH FREQUENCY MONO STRZ (ELECTRODE) IMPLANT
CLOSURE WOUND 1/4X4 (GAUZE/BANDAGES/DRESSINGS) ×1
COUNTER NEEDLE 1200 MAGNETIC (NEEDLE) ×2 IMPLANT
COVER BACK TABLE 60X90IN (DRAPES) ×4 IMPLANT
COVER MAYO STAND STRL (DRAPES) ×4 IMPLANT
COVER WAND RF STERILE (DRAPES) ×4 IMPLANT
DECANTER SPIKE VIAL GLASS SM (MISCELLANEOUS) ×4 IMPLANT
DERMABOND ADVANCED (GAUZE/BANDAGES/DRESSINGS) ×2
DERMABOND ADVANCED .7 DNX12 (GAUZE/BANDAGES/DRESSINGS) IMPLANT
DRSG OPSITE POSTOP 3X4 (GAUZE/BANDAGES/DRESSINGS) IMPLANT
DURAPREP 26ML APPLICATOR (WOUND CARE) ×4 IMPLANT
ELECT REM PT RETURN 9FT ADLT (ELECTROSURGICAL) ×4
ELECTRODE REM PT RTRN 9FT ADLT (ELECTROSURGICAL) ×2 IMPLANT
GAUZE 4X4 16PLY RFD (DISPOSABLE) ×4 IMPLANT
GAUZE SPONGE 2X2 8PLY STRL LF (GAUZE/BANDAGES/DRESSINGS) IMPLANT
GLOVE BIOGEL PI IND STRL 7.0 (GLOVE) ×4 IMPLANT
GLOVE BIOGEL PI INDICATOR 7.0 (GLOVE) ×6
GOWN STRL REUS W/TWL LRG LVL3 (GOWN DISPOSABLE) ×6 IMPLANT
HEMOSTAT ARISTA ABSORB 3G PWDR (HEMOSTASIS) ×2 IMPLANT
LEGGING LITHOTOMY PAIR STRL (DRAPES) ×4 IMPLANT
LIGASURE VESSEL 5MM BLUNT TIP (ELECTROSURGICAL) IMPLANT
NEEDLE INSUFFLATION 120MM (ENDOMECHANICALS) IMPLANT
NS IRRIG 1000ML POUR BTL (IV SOLUTION) ×4 IMPLANT
PACK LAVH (CUSTOM PROCEDURE TRAY) ×4 IMPLANT
PACK ROBOTIC GOWN (GOWN DISPOSABLE) ×4 IMPLANT
PACK TRENDGUARD 450 HYBRID PRO (MISCELLANEOUS) IMPLANT
POUCH SPECIMEN RETRIEVAL 10MM (ENDOMECHANICALS) IMPLANT
PROTECTOR NERVE ULNAR (MISCELLANEOUS) ×8 IMPLANT
SET SUCTION IRRIG HYDROSURG (IRRIGATION / IRRIGATOR) ×2 IMPLANT
SET TUBE SMOKE EVAC HIGH FLOW (TUBING) ×4 IMPLANT
SLEEVE XCEL OPT CAN 5 100 (ENDOMECHANICALS) ×4 IMPLANT
SOLUTION ELECTROLUBE (MISCELLANEOUS) ×4 IMPLANT
SPONGE GAUZE 2X2 STER 10/PKG (GAUZE/BANDAGES/DRESSINGS) ×2
STRIP CLOSURE SKIN 1/4X4 (GAUZE/BANDAGES/DRESSINGS) ×3 IMPLANT
SUT MNCRL AB 3-0 PS2 27 (SUTURE) IMPLANT
SUT MNCRL AB 4-0 PS2 18 (SUTURE) ×4 IMPLANT
SUT PROLENE 0 CT 1 30 (SUTURE) IMPLANT
SUT VIC AB 0 CT1 18XCR BRD8 (SUTURE) ×4 IMPLANT
SUT VIC AB 0 CT1 27 (SUTURE)
SUT VIC AB 0 CT1 27XBRD ANBCTR (SUTURE) IMPLANT
SUT VIC AB 0 CT1 8-18 (SUTURE) ×8
SUT VIC AB 1 CT1 36 (SUTURE) IMPLANT
SUT VIC AB 2-0 CT1 (SUTURE) IMPLANT
SUT VIC AB 4-0 PS2 27 (SUTURE) IMPLANT
SUT VICRYL 0 TIES 12 18 (SUTURE) ×4 IMPLANT
SUT VICRYL 0 UR6 27IN ABS (SUTURE) ×8 IMPLANT
TAPE PAPER 2X10 WHT MICROPORE (GAUZE/BANDAGES/DRESSINGS) ×2 IMPLANT
TOWEL OR 17X26 10 PK STRL BLUE (TOWEL DISPOSABLE) ×4 IMPLANT
TRAY FOLEY W/BAG SLVR 14FR (SET/KITS/TRAYS/PACK) ×4 IMPLANT
TRENDGUARD 450 HYBRID PRO PACK (MISCELLANEOUS)
TROCAR BALLN 12MMX100 BLUNT (TROCAR) IMPLANT
TROCAR BLADELESS OPT 5 100 (ENDOMECHANICALS) ×4 IMPLANT
TROCAR XCEL NON-BLD 11X100MML (ENDOMECHANICALS) IMPLANT
WARMER LAPAROSCOPE (MISCELLANEOUS) ×4 IMPLANT

## 2019-11-17 NOTE — Transfer of Care (Signed)
Immediate Anesthesia Transfer of Care Note  Patient: Gloria Weber  Procedure(s) Performed: Procedure(s) (LRB): LAPAROSCOPIC ASSISTED VAGINAL HYSTERECTOMY (Bilateral) CYSTOSCOPY  Patient Location: PACU  Anesthesia Type: General  Level of Consciousness: awake, oriented, sedated and patient cooperative  Airway & Oxygen Therapy: Patient Spontanous Breathing and Patient connected to face mask oxygen  Post-op Assessment: Report given to PACU RN and Post -op Vital signs reviewed and stable  Post vital signs: Reviewed and stable  Complications: No apparent anesthesia complications Last Vitals:  Vitals Value Taken Time  BP 118/81 11/17/19 1615  Temp    Pulse 70 11/17/19 1616  Resp 9 11/17/19 1616  SpO2 100 % 11/17/19 1616  Vitals shown include unvalidated device data.  Last Pain:  Vitals:   11/17/19 1041  TempSrc: Oral

## 2019-11-17 NOTE — Anesthesia Preprocedure Evaluation (Addendum)
Anesthesia Evaluation  Patient identified by MRN, date of birth, ID band Patient awake    Reviewed: Allergy & Precautions, NPO status , Patient's Chart, lab work & pertinent test results  Airway Mallampati: II  TM Distance: >3 FB Neck ROM: Full    Dental no notable dental hx.    Pulmonary former smoker,    Pulmonary exam normal breath sounds clear to auscultation       Cardiovascular negative cardio ROS Normal cardiovascular exam Rhythm:Regular Rate:Normal     Neuro/Psych PSYCHIATRIC DISORDERS ADD (attention deficit disorder)    GI/Hepatic negative GI ROS, (+)     substance abuse  ,   Endo/Other  negative endocrine ROS  Renal/GU Renal disease     Musculoskeletal negative musculoskeletal ROS (+) narcotic dependent  Abdominal   Peds  Hematology negative hematology ROS (+)   Anesthesia Other Findings Endometriosis of the Pelvis  Reproductive/Obstetrics S/p BTL                           Anesthesia Physical Anesthesia Plan  ASA: II  Anesthesia Plan: General   Post-op Pain Management:    Induction: Intravenous  PONV Risk Score and Plan: 4 or greater and Scopolamine patch - Pre-op, Midazolam, Dexamethasone, Ondansetron and Treatment may vary due to age or medical condition  Airway Management Planned: Oral ETT  Additional Equipment:   Intra-op Plan:   Post-operative Plan: Extubation in OR  Informed Consent: I have reviewed the patients History and Physical, chart, labs and discussed the procedure including the risks, benefits and alternatives for the proposed anesthesia with the patient or authorized representative who has indicated his/her understanding and acceptance.     Dental advisory given  Plan Discussed with: CRNA  Anesthesia Plan Comments: (Ketamine Precedex)      Anesthesia Quick Evaluation

## 2019-11-17 NOTE — Op Note (Addendum)
Gloria Weber DOB [birth year] MRN: [id number]  Date of procedure: 11/17/2019.   Pre-op diagnosis:  1. Endometriosis. 2. Chronic pelvic pain.  3. Recurrent right sided mid and lower abdominal pain 4. Abnormal uterine bleeding with irregular intermenstrual bleeding.  5. Uterine polyp. 6. Failed medical management with orilissa and narcotics and now desiring definitive management via hysterectomy and possible appendectomy.     Post-op diagnosis: Same as above   Surgeries: Laparoscopic Assisted Vaginal hysterectomy and cystoscopy     Surgeon: Dr. Hoover Browns    Assistant: Henreitta Leber, PA  Intra-op consultation: General Surgery, Dr. Gerrit Friends.    Anesthesia: General ETA   Complications: None  EBL: 100 cc  IVfluids: 1500 cc NS  Urine output: 500 cc   Findings: 8 week sized uterus.  Normal right and left ovaries.  Absent fallopian tubes, previously removed.  Normal bladder with strong bilateral ureteral jets.  Normal cul de sacs and no visible endometriosis lesions.       Indications: 34 y/o P3 with a long history of chronic pelvic pain, endometriosis, recurrent right sided mid and lower abdominal pain, abnormal uterine bleeding, uterine polyp and who failed medical management, who now desired definitive management via hysterectomy and possible appendectomy.         Procedure: Informed consent was obtained from the patient. She was taken to the operating room where anesthesia was administered.  She was carefully positioned in the operating room table in dorsal lithotomy position with both arms tucked to her sides.  An exam under anesthesia revealed a 8 week sized uterus with no palpable adnexal masses.  She was prepped and draped in the usual sterile fashion.  Weighted speculum and anterior Heaney tractors were used to view the cervix.  Hulka tenaculum was placed into cervix.  Foley catheter placed in the bladder.    Attention was then turned to the abdomen.  1% lidocaine with  epinephrine was injected in umbilical area. 10 mm incision made in umbilicus skin over her prior laparoscopy incision scar.  Open entry through the abdomen layers done to enter the peritoneum.  68mm Hasson trocar was placed in and abdomen insufflated with CO2 gas.  Patient placed in trendelenburg and above findings noted.  Two 42mm ports were placed bilaterally at umbilicus level about 10 cm bilaterally. The abdomen was surveyed with above findings.  The appendix was identified and intra-op consultation with Dr. Gerrit Friends done who looked at the appendix and declared it normal appearing hence no need to remove it.  The left uterosacral and left round ligament were ligasured and then transected.  A similar procedure was done on the right side to get the pedicles.    All laparocopic instruments were then removed leaving the ports in place and attention was then turned to the vagina, gas was turned off.    Weighted speculum was placed into vagina and anterior retractor placed in.  Hulka tenaculum was then removed.  Two single tooth tenaculum were placed on cervix at 3 and 9 o'clocks vertically.  The cervix was injected with lidocaine with epinephrine circumferentially about 1.5 cm from the external os.  Colpotomy was created with bovie.  The vaginal epithelium was dissected off the cervical stroma anterioly and posteriorly.  The rectovaginal space was entered then posterior peritoneum was identifed  and then entered sharply with the Mayo scissors.  Entry into the anterior cul de sac was performed after dissecting off the vaginal epithelium from the cervix and identifying the anterior peritoneal fold, which was  then transected.  The uterosacral and cardinal  ligaments bilaterally were clamped, cut and suture ligated with transfixing stitch, 0- vicryl was used unless stated otherwise.  The uterine vessels were similarly clamped, cut and suture ligated.  The broad ligament was similarly clamped and ligated bilaterally and  the uterus and cervix free to be delivered.  All pedicles were examined and noted to the hemostatic.  External McCall Culdoplasty was performed with 2-0 vicryl.  Internal McCall stitches were performed with 0 prolene and tied at the center.  Vaginal wall was closed horizontally with 2-0vicryl in a running stitch.  External Mcall suture was tied off with good support noted.  Cystoscopy was perfomed after indigo carmine administration revealing a normal bladder and bilateral patent ureters with jets.  Foley catheter was then replaced back into the bladder.  Attention was turned back to abdomen where CO2 gas was insufflated.  Irrigation was applied and suctioned out. The serosal edges on the vaginal cuff was noted to have small bleeding and arista powder was applied with good hemostasis noted.  Intercede was also applied over the vaginal cuff.  All instruments were removed and trocar sites noted to be hemostatic.  CO2 was allowed to escape the abdomen and anesthesia gave manual inflations.  Final count was correct.  0-vicryl used to close the infraumbilical incision fascia then 3-0 monocryl/vicryl was used to close the abdominal skin incisions in subcuticular stitches.  Dermabond applied and gauze pad dressing over the umbilical incision.  She was then cleaned, awoken from anesthesia and taken to recovery room in stable condition.    Specimen:  Uterus with cervix.   Disposition: TO PACU in stable condition.    Dr.Hau Sanor.  11/17/2019.

## 2019-11-17 NOTE — Anesthesia Postprocedure Evaluation (Signed)
Anesthesia Post Note  Patient: Gloria Weber  Procedure(s) Performed: LAPAROSCOPIC ASSISTED VAGINAL HYSTERECTOMY (Bilateral ) CYSTOSCOPY     Patient location during evaluation: PACU Anesthesia Type: General Level of consciousness: awake and alert Pain management: pain level controlled Vital Signs Assessment: post-procedure vital signs reviewed and stable Respiratory status: spontaneous breathing, nonlabored ventilation, respiratory function stable and patient connected to nasal cannula oxygen Cardiovascular status: blood pressure returned to baseline and stable Postop Assessment: no apparent nausea or vomiting Anesthetic complications: no   No complications documented.  Last Vitals:  Vitals:   11/17/19 1640 11/17/19 1645  BP:  104/79  Pulse: 75 71  Resp: 13 12  Temp:    SpO2: 100% 100%    Last Pain:  Vitals:   11/17/19 1041  TempSrc: Oral                 Trevor Iha

## 2019-11-17 NOTE — Anesthesia Procedure Notes (Signed)
Procedure Name: Intubation Date/Time: 11/17/2019 1:05 PM Performed by: Myna Bright, CRNA Pre-anesthesia Checklist: Patient identified, Emergency Drugs available, Suction available and Patient being monitored Patient Re-evaluated:Patient Re-evaluated prior to induction Oxygen Delivery Method: Circle system utilized Preoxygenation: Pre-oxygenation with 100% oxygen Induction Type: IV induction Ventilation: Mask ventilation without difficulty Laryngoscope Size: Mac and 3 Grade View: Grade I Tube type: Oral Tube size: 7.5 mm Number of attempts: 1 Airway Equipment and Method: Stylet Placement Confirmation: ETT inserted through vocal cords under direct vision,  positive ETCO2 and breath sounds checked- equal and bilateral Secured at: 21 cm Tube secured with: Tape Dental Injury: Teeth and Oropharynx as per pre-operative assessment

## 2019-11-17 NOTE — Interval H&P Note (Signed)
History and Physical Interval Note:  11/17/2019 12:52 PM  Gloria Weber  has presented today for surgery, with the diagnosis of Endometriosis of the Pelvis.  The various methods of treatment have been discussed with the patient and family. After consideration of risks, benefits and other options for treatment, the patient has consented to  LAPAROSCOPIC ASSISTED VAGINAL HYSTERECTOMY, CYSTOSCOPY AND POSSIBLE APPENDECTOMYas a surgical intervention.  The patient's history has been reviewed, patient examined, no change in status, stable for surgery.  I have reviewed the patient's chart and labs.  Questions were answered to the patient's satisfaction.   Konrad Felix, MD.

## 2019-11-18 ENCOUNTER — Encounter (HOSPITAL_BASED_OUTPATIENT_CLINIC_OR_DEPARTMENT_OTHER): Payer: Self-pay | Admitting: Obstetrics & Gynecology

## 2019-11-18 DIAGNOSIS — N800 Endometriosis of uterus: Secondary | ICD-10-CM | POA: Diagnosis not present

## 2019-11-18 LAB — CBC
HCT: 34 % — ABNORMAL LOW (ref 36.0–46.0)
Hemoglobin: 11.3 g/dL — ABNORMAL LOW (ref 12.0–15.0)
MCH: 30.7 pg (ref 26.0–34.0)
MCHC: 33.2 g/dL (ref 30.0–36.0)
MCV: 92.4 fL (ref 80.0–100.0)
Platelets: 218 10*3/uL (ref 150–400)
RBC: 3.68 MIL/uL — ABNORMAL LOW (ref 3.87–5.11)
RDW: 12.9 % (ref 11.5–15.5)
WBC: 16.4 10*3/uL — ABNORMAL HIGH (ref 4.0–10.5)
nRBC: 0 % (ref 0.0–0.2)

## 2019-11-18 LAB — BASIC METABOLIC PANEL
Anion gap: 6 (ref 5–15)
BUN: 6 mg/dL (ref 6–20)
CO2: 29 mmol/L (ref 22–32)
Calcium: 8.5 mg/dL — ABNORMAL LOW (ref 8.9–10.3)
Chloride: 104 mmol/L (ref 98–111)
Creatinine, Ser: 0.52 mg/dL (ref 0.44–1.00)
GFR calc Af Amer: >60 mL/min (ref >60)
GFR calc non Af Amer: >60 mL/min (ref >60)
Glucose, Bld: 126 mg/dL — ABNORMAL HIGH (ref 70–99)
Potassium: 3.8 mmol/L (ref 3.5–5.1)
Sodium: 139 mmol/L (ref 135–145)

## 2019-11-18 LAB — SURGICAL PATHOLOGY

## 2019-11-18 MED ORDER — HYDROCODONE-ACETAMINOPHEN 5-325 MG PO TABS
ORAL_TABLET | ORAL | Status: AC
  Filled 2019-11-18: qty 2

## 2019-11-18 MED ORDER — TRAMADOL HCL 50 MG PO TABS: ORAL_TABLET | ORAL | 0 refills | Status: AC

## 2019-11-18 MED ORDER — HYDROMORPHONE HCL 1 MG/ML IJ SOLN
INTRAMUSCULAR | Status: AC
  Filled 2019-11-18: qty 1

## 2019-11-18 MED ORDER — HYDROCODONE-ACETAMINOPHEN 5-325 MG PO TABS: ORAL_TABLET | ORAL | 0 refills | Status: AC

## 2019-11-18 NOTE — Progress Notes (Signed)
Dr. Sallye Ober called for update on pt and also wanted to know why lab work wasn't drawn yet, explained lab has been called and will call again.  Pt ambulating in hall voided and BP running in 90's/50's. No new orders.

## 2019-11-18 NOTE — Progress Notes (Signed)
Gloria Weber is a1 y.o.  [id number]  Post Op Date #1:  LAVH/Cystoscopy  Subjective: Patient is Doing well postoperatively. Patient has Pain is not well controlled.  Medications being used: narcotic analgesics including Vicodin 5/325 mg and Dilaudid 1 mg IV prn. Patient is ambulating in the halls, voiding without difficulty and tolerating a regular diet.    Objective: Vital signs in last 24 hours: Temp:  [98 F (36.7 C)-99 F (37.2 C)] 98.3 F (36.8 C) (06/30 0309) Pulse Rate:  [55-83] 55 (06/30 0309) Resp:  [11-18] 16 (06/30 0309) BP: (92-122)/(62-86) 92/62 (06/30 0309) SpO2:  [97 %-100 %] 98 % (06/30 0309) Weight:  [66.3 kg] 66.3 kg (06/29 1041)  Intake/Output from previous day: 06/29 0701 - 06/30 0700 In: 2895 [P.O.:720; I.V.:2075] Out: 2420 [Urine:2320] Intake/Output this shift: No intake/output data recorded. Recent Labs  Lab 11/18/19 0638  WBC 16.4*  HGB 11.3*  HCT 34.0*  PLT 218     Recent Labs  Lab 11/18/19 0638  NA 139  K 3.8  CL 104  CO2 29  BUN 6  CREATININE 0.52  CALCIUM 8.5*  GLUCOSE 126*    EXAM: General: alert, cooperative and no distress Resp: clear to auscultation bilaterally Cardio: regular rate and rhythm, S1, S2 normal, no murmur, click, rub or gallop GI: bowel sounds present in all 4 quadrants; umbilical dressing is clean/dry/intact and lower abdominal incisions intact, without evidence of infection Extremities: Homans sign is negative, no sign of DVT and SCD hose on and functioning; no calf tenderness.   Assessment: s/p Procedure(s): LAPAROSCOPIC ASSISTED VAGINAL HYSTERECTOMY CYSTOSCOPY: stable, progressing well and tolerating diet  Plan: Discharge home Routine Care  LOS: 0 days    Henreitta Leber, PA-C 11/18/2019 7:19 AM

## 2019-11-18 NOTE — Discharge Instructions (Signed)
Laparoscopically Assisted Vaginal Hysterectomy, Care After This sheet gives you information about how to care for yourself after your procedure. Your health care provider may also give you more specific instructions. If you have problems or questions, contact your health care provider. What can I expect after the procedure? After the procedure, it is common to have:  Soreness and numbness in your incision areas.  Abdominal pain. You will be given pain medicine to control it.  Vaginal bleeding and discharge. You will need to use a sanitary napkin after this procedure.  Sore throat from the breathing tube that was inserted during surgery. Follow these instructions at home: Medicines  Take over-the-counter and prescription medicines only as told by your health care provider.  Do not take aspirin or ibuprofen. These medicines can cause bleeding.  Do not drive or use heavy machinery while taking prescription pain medicine.  Do not drive for 24 hours if you were given a medicine to help you relax (sedative) during the procedure. Incision care   Follow instructions from your health care provider about how to take care of your incisions. Make sure you: ? Wash your hands with soap and water before you change your bandage (dressing). If soap and water are not available, use hand sanitizer. ? Change your dressing as told by your health care provider. ? Leave stitches (sutures), skin glue, or adhesive strips in place. These skin closures may need to stay in place for 2 weeks or longer. If adhesive strip edges start to loosen and curl up, you may trim the loose edges. Do not remove adhesive strips completely unless your health care provider tells you to do that.  Check your incision area every day for signs of infection. Check for: ? Redness, swelling, or pain. ? Fluid or blood. ? Warmth. ? Pus or a bad smell. Activity  Get regular exercise as told by your health care provider. You may be  told to take short walks every day and go farther each time.  Return to your normal activities as told by your health care provider. Ask your health care provider what activities are safe for you.  Do not douche, use tampons, or have sexual intercourse for at least 6 weeks, or until your health care provider gives you permission.  Do not lift anything that is heavier than 10 lb (4.5 kg), or the limit that your health care provider tells you, until he or she says that it is safe. General instructions  Do not take baths, swim, or use a hot tub until your health care provider approves. Take showers instead of baths.  Do not drive for 24 hours if you received a sedative.  Do not drive or operate heavy machinery while taking prescription pain medicine.  To prevent or treat constipation while you are taking prescription pain medicine, your health care provider may recommend that you: ? Drink enough fluid to keep your urine clear or pale yellow. ? Take over-the-counter or prescription medicines. ? Eat foods that are high in fiber, such as fresh fruits and vegetables, whole grains, and beans. ? Limit foods that are high in fat and processed sugars, such as fried and sweet foods.  Keep all follow-up visits as told by your health care provider. This is important. Contact a health care provider if:  You have signs of infection, such as: ? Redness, swelling, or pain around your incision sites. ? Fluid or blood coming from an incision. ? An incision that feels warm to the   touch. ? Pus or a bad smell coming from an incision.  Your incision breaks open.  Your pain medicine is not helping.  You feel dizzy or light-headed.  You have pain or bleeding when you urinate.  You have persistent nausea and vomiting.  You have blood, pus, or a bad-smelling discharge from your vagina. Get help right away if:  You have a fever.  You have severe abdominal pain.  You have chest pain.  You have  shortness of breath.  You faint.  You have pain, swelling, or redness in your leg.  You have heavy bleeding from your vagina. Summary  After the procedure, it is common to have abdominal pain and vaginal bleeding.  You should not drive or lift heavy objects until your health care provider says that it is safe.  Contact your health care provider if you have any symptoms of infection, excessive vaginal bleeding, nausea, vomiting, or shortness of breath. This information is not intended to replace advice given to you by your health care provider. Make sure you discuss any questions you have with your health care provider. Document Revised: 04/19/2017 Document Reviewed: 07/03/2016 Elsevier Patient Education  2020 [employer]. Call [facility] @ [phone] if:  You have a temperature greater than or equal to 100.4 degrees Farenheit orally You have pain that is not made better by the pain medication given and taken as directed You have excessive bleeding or problems urinating  Take Colace (Docusate Sodium/Stool Softener) 100 mg 2-3 times daily while taking narcotic pain medicine to avoid constipation or until bowel movements are regular.   You may drive after 2 weeks You may walk up steps  You may shower tomorrow You may resume a regular diet  Keep incisions clean and dry Do not lift over 15 pounds for 6 weeks Avoid anything in vagina for 6 weeks

## 2019-11-18 NOTE — Discharge Summary (Signed)
Physician Discharge Summary  Patient ID: Gloria Weber MRN: [id number] DOB/AGE: Nov 27, 1985 34 y.o.  Admit date: 11/17/2019 Discharge date: 11/18/2019   Discharge Diagnoses:  Active Problems:   Chronic pelvic pain in female   Endometriosis of uterus Dysfunctional Uterine Bleeding Uterine Polyp  Operation: Laparoscopically Assisted Total Vaginal Hysterectomy with Cystoscopy   Discharged Condition: Good  Hospital Course: On the date of admission the patient underwent the aforementioned procedures and tolerated them well.  Post operative course was unremarkable with the patient resuming bowel and bladder function by post operative day #1 and was therefore deemed ready for discharge home.  Discharge hemoglobin was 11.3.  Disposition: Home to Self Care  Discharge Medications:  Allergies as of 11/18/2019      Reactions   Nsaids Other (See Comments)   EXCESSIVE BLEEDING, STOMACHACHE   Adhesive [tape] Rash   PLASTIC TAPE      Medication List    STOP taking these medications   Orilissa 150 MG Tabs Generic drug: Elagolix Sodium     TAKE these medications   CALCIUM PO Take 1 tablet by mouth daily.   HYDROcodone-acetaminophen 5-325 MG tablet Commonly known as: NORCO/VICODIN take 1 tablet po every 6  hours as needed for post operative pain What changed: additional instructions   lidocaine 5 % Commonly known as: LIDODERM Place 1 patch onto the skin daily as needed (pain).   methylphenidate 20 MG ER tablet Commonly known as: METADATE ER Take 20 mg by mouth in the morning, at noon, and at bedtime. What changed: Another medication with the same name was removed. Continue taking this medication, and follow the directions you see here.   multivitamin tablet Take 1 tablet by mouth daily.   traMADol 50 MG tablet Commonly known as: Ultram take 2 tablets po every 8 hours as needed for breakthrough post operative  pain What changed:   how much to take  how to take  this  when to take this  reasons to take this  additional instructions          Follow-up:   Dr. Hoover Browns on December 28, 2019 at 11 a.m.   Signed: Henreitta Leber, PA-C 11/18/2019, 7:53 AM
